# Patient Record
Sex: Male | Born: 1980 | Race: White | Hispanic: No | Marital: Married | Smoking: Never smoker
Health system: Southern US, Community
[De-identification: ages and names within clinical notes are randomized; demographics above are authoritative.]

## PROBLEM LIST (undated history)

## (undated) DIAGNOSIS — D689 Coagulation defect, unspecified: Secondary | ICD-10-CM

## (undated) DIAGNOSIS — M7989 Other specified soft tissue disorders: Secondary | ICD-10-CM

## (undated) DIAGNOSIS — B1920 Unspecified viral hepatitis C without hepatic coma: Secondary | ICD-10-CM

## (undated) DIAGNOSIS — G4733 Obstructive sleep apnea (adult) (pediatric): Secondary | ICD-10-CM

## (undated) DIAGNOSIS — T7840XA Allergy, unspecified, initial encounter: Secondary | ICD-10-CM

## (undated) DIAGNOSIS — D67 Hereditary factor IX deficiency: Secondary | ICD-10-CM

## (undated) HISTORY — DX: Coagulation defect, unspecified: D68.9

## (undated) HISTORY — DX: Allergy, unspecified, initial encounter: T78.40XA

## (undated) HISTORY — DX: Hereditary factor IX deficiency: D67

## (undated) HISTORY — PX: TONSILLECTOMY: SUR1361

---

## 2003-06-16 ENCOUNTER — Emergency Department (HOSPITAL_COMMUNITY): Admission: AD | Admit: 2003-06-16 | Discharge: 2003-06-16 | Payer: Self-pay | Admitting: Family Medicine

## 2012-05-31 ENCOUNTER — Ambulatory Visit: Payer: Self-pay | Admitting: Family Medicine

## 2012-05-31 VITALS — BP 114/79 | HR 86 | Temp 98.0°F | Resp 17 | Ht 71.5 in | Wt 323.0 lb

## 2012-05-31 DIAGNOSIS — B86 Scabies: Secondary | ICD-10-CM

## 2012-05-31 DIAGNOSIS — L282 Other prurigo: Secondary | ICD-10-CM

## 2012-05-31 MED ORDER — HYDROXYZINE HCL 25 MG PO TABS: 25.0000 mg | ORAL_TABLET | Freq: Three times a day (TID) | ORAL | Status: DC | PRN

## 2012-05-31 MED ORDER — PERMETHRIN 5 % EX CREA: TOPICAL_CREAM | Freq: Once | CUTANEOUS | Status: DC

## 2012-05-31 NOTE — Progress Notes (Signed)
  [facility]:  Office Visit  Chief Complaint:  Chief Complaint  Patient presents with  . Rash    all over body     HPI: Chris Winters is a 31 y.o. male who complains of  Rash all over body, itchy, started 2 months, girlfriend had it and was given scabies medicine. Tried otc meds ie hydrocortisone without releif.   Past Medical History  Diagnosis Date  . Hemophilia B    Past Surgical History  Procedure Date  . Tonsillectomy    History   Social History  . Marital Status: Single    Spouse Name: N/A    Number of Children: N/A  . Years of Education: N/A   Social History Main Topics  . Smoking status: Never Smoker   . Smokeless tobacco: None  . Alcohol Use: No  . Drug Use: No  . Sexually Active: Yes   Other Topics Concern  . None   Social History Narrative  . None   Family History  Problem Relation Age of Onset  . Diabetes Father   . Heart disease Father   . Cancer Maternal Grandmother   . Cancer Maternal Grandfather    Allergies  Allergen Reactions  . Penicillins    Prior to Admission medications   Medication Sig Start Date End Date Taking? Authorizing Provider  diphenhydrAMINE (BENADRYL) 25 mg capsule Take 25 mg by mouth every 6 (six) hours as needed.   Yes Historical Provider, MD  Pseudoephedrine-APAP-DM (DAYQUIL MULTI-SYMPTOM COLD/FLU PO) Take by mouth.   Yes Historical Provider, MD     ROS: The patient denies fevers, chills, night sweats, unintentional weight loss, chest pain, palpitations, wheezing, dyspnea on exertion, nausea, vomiting, abdominal pain, dysuria, hematuria, melena, numbness, weakness, or tingling.   All other systems have been reviewed and were otherwise negative with the exception of those mentioned in the HPI and as above.    PHYSICAL EXAM: Filed Vitals:   05/31/12 1018  BP: 114/79  Pulse: 86  Temp: 98 F (36.7 C)  Resp: 17   Filed Vitals:   05/31/12 1018  Height: 5' 11.5" (1.816 m)  Weight: 323 lb  (146.512 kg)   Body mass index is 44.42 kg/(m^2).  General: Alert, no acute distress HEENT:  Normocephalic, atraumatic, oropharynx patent.  Cardiovascular:  Regular rate and rhythm, no rubs murmurs or gallops.  No Carotid bruits, radial pulse intact. No pedal edema.  Respiratory: Clear to auscultation bilaterally.  No wheezes, rales, or rhonchi.  No cyanosis, no use of accessory musculature GI: No organomegaly, abdomen is soft and non-tender, positive bowel sounds.  No masses. Skin: + scabies, exocriated rash  Neurologic: Facial musculature symmetric. Psychiatric: Patient is appropriate throughout our interaction. Lymphatic: No cervical lymphadenopathy Musculoskeletal: Gait intact.   LABS: No results found for this or any previous visit.   EKG/XRAY:   Primary read interpreted by Dr. Conley Rolls at [facility]   ASSESSMENT/PLAN: Encounter Diagnoses  Name Primary?  . Scabies Yes  . Pruritic rash    Rx Permetherin refill x 1 may repeat in 2-4 weeks if still have residual outbreak. Precautions given Gave 90 gram since he is a big guy Rx Vistaril for itch F/u prn    Chris Cowles PHUONG, DO 05/31/2012 11:12 AM

## 2016-06-13 ENCOUNTER — Emergency Department: Payer: Self-pay

## 2016-06-13 ENCOUNTER — Encounter: Payer: Self-pay | Admitting: Emergency Medicine

## 2016-06-13 ENCOUNTER — Emergency Department
Admission: EM | Admit: 2016-06-13 | Discharge: 2016-06-13 | Payer: Self-pay | Attending: Emergency Medicine | Admitting: Emergency Medicine

## 2016-06-13 DIAGNOSIS — R609 Edema, unspecified: Secondary | ICD-10-CM | POA: Insufficient documentation

## 2016-06-13 DIAGNOSIS — R000 Tachycardia, unspecified: Secondary | ICD-10-CM | POA: Insufficient documentation

## 2016-06-13 DIAGNOSIS — Z79899 Other long term (current) drug therapy: Secondary | ICD-10-CM | POA: Insufficient documentation

## 2016-06-13 DIAGNOSIS — R0602 Shortness of breath: Secondary | ICD-10-CM | POA: Insufficient documentation

## 2016-06-13 LAB — BASIC METABOLIC PANEL
ANION GAP: 8 (ref 5–15)
BUN: 18 mg/dL (ref 6–20)
CALCIUM: 8.8 mg/dL — AB (ref 8.9–10.3)
CO2: 21 mmol/L — AB (ref 22–32)
Chloride: 104 mmol/L (ref 101–111)
Creatinine, Ser: 1.21 mg/dL (ref 0.61–1.24)
GFR calc non Af Amer: >60 mL/min (ref >60)
GLUCOSE: 138 mg/dL — AB (ref 65–99)
POTASSIUM: 3.9 mmol/L (ref 3.5–5.1)
Sodium: 133 mmol/L — ABNORMAL LOW (ref 135–145)

## 2016-06-13 LAB — CBC
HEMATOCRIT: 53.1 % — AB (ref 40.0–52.0)
HEMOGLOBIN: 18.2 g/dL — AB (ref 13.0–18.0)
MCH: 30.2 pg (ref 26.0–34.0)
MCHC: 34.2 g/dL (ref 32.0–36.0)
MCV: 88.2 fL (ref 80.0–100.0)
Platelets: 149 10*3/uL — ABNORMAL LOW (ref 150–440)
RBC: 6.02 MIL/uL — AB (ref 4.40–5.90)
RDW: 13.9 % (ref 11.5–14.5)
WBC: 12.5 10*3/uL — ABNORMAL HIGH (ref 3.8–10.6)

## 2016-06-13 LAB — BRAIN NATRIURETIC PEPTIDE: B NATRIURETIC PEPTIDE 5: 17 pg/mL (ref 0.0–100.0)

## 2016-06-13 LAB — TROPONIN I

## 2016-06-13 MED ORDER — FUROSEMIDE 10 MG/ML IJ SOLN
INTRAMUSCULAR | Status: AC
  Administered 2016-06-13: 40 mg via INTRAVENOUS
  Filled 2016-06-13: qty 4

## 2016-06-13 MED ORDER — IBUPROFEN 600 MG PO TABS
600.0000 mg | ORAL_TABLET | Freq: Once | ORAL | Status: AC
  Administered 2016-06-13: 600 mg via ORAL

## 2016-06-13 MED ORDER — FUROSEMIDE 10 MG/ML IJ SOLN
40.0000 mg | Freq: Once | INTRAMUSCULAR | Status: AC
Start: 2016-06-13 — End: 2016-06-13
  Administered 2016-06-13: 40 mg via INTRAVENOUS

## 2016-06-13 MED ORDER — IBUPROFEN 600 MG PO TABS
ORAL_TABLET | ORAL | Status: AC
  Administered 2016-06-13: 600 mg via ORAL
  Filled 2016-06-13: qty 1

## 2016-06-13 NOTE — ED Triage Notes (Signed)
Pt reports SOB and chest pain for past few days.

## 2016-06-13 NOTE — ED Notes (Signed)
Pt was off the monitor and had taken off the oxygen to eat and go to the bathroom. States feeling more short of breath. Back on oxygen and the monitor. sp02 stable and pt states feels better

## 2016-06-13 NOTE — ED Triage Notes (Signed)
Pt c/o SOB, cough chest pain and fever worsening over the past 2 weeks. Tmax of 104. HR 120 o2 Sat 89%

## 2016-06-13 NOTE — ED Notes (Signed)
EKG delayed due to Xray took patient before I got back to the room.

## 2016-06-13 NOTE — ED Notes (Signed)
Pt c/o feeling sob, states he attempted to lay on his left side to sleep and felt like he couldn't breath. Dr. Cyril Loosenkinner made aware, respiratory called for venti mask

## 2016-06-13 NOTE — ED Notes (Signed)
Ems on scene for transport 

## 2016-06-13 NOTE — ED Provider Notes (Signed)
[facility] Emergency Department Provider Note   ____________________________________________    I have reviewed the triage vital signs and the nursing notes.   HISTORY  Chief Complaint Shortness of breath    HPI Chris Winters is a 35 y.o. male who presents with complaints of shortness of breath which started yesterday. Patient reports that yesterday he started coughing and developed shortness of breath with exertion also with lying flat. He reports chest discomfort particularly when coughing. He denies recent travel. No calf pain or leg swelling. No fevers or chills. He reports an upper respiratory infection 3 weeks ago. He does have a history of hemophilia but no traumatic injuries. Denies a history of hypertension. He has never had this before   Past Medical History:  Diagnosis Date  . Hemophilia B (HCC)     There are no active problems to display for this patient.   Past Surgical History:  Procedure Laterality Date  . TONSILLECTOMY      Prior to Admission medications   Medication Sig Start Date End Date Taking? Authorizing Provider  diphenhydrAMINE (BENADRYL) 25 mg capsule Take 25 mg by mouth every 6 (six) hours as needed.    Historical Provider, MD  hydrOXYzine (ATARAX/VISTARIL) 25 MG tablet Take 1 tablet (25 mg total) by mouth 3 (three) times daily as needed for itching. 05/31/12   Thao P Le, DO  permethrin (ELIMITE) 5 % cream Apply topically once. May repeat in 2-4 weeks x 1 05/31/12   Thao P Le, DO  Pseudoephedrine-APAP-DM (DAYQUIL MULTI-SYMPTOM COLD/FLU PO) Take by mouth.    Historical Provider, MD     Allergies Penicillins and Prednisone  Family History  Problem Relation Age of Onset  . Diabetes Father   . Heart disease Father   . Cancer Maternal Grandmother   . Cancer Maternal Grandfather     Social History Social History  Substance Use Topics  . Smoking status: Never Smoker  . Smokeless tobacco: Never Used  . Alcohol  use No    Review of Systems  Constitutional: No fever/chills Eyes: No visual changes.   Cardiovascular: Discomfort of the chest as above Respiratory:Shortness of breath as above Gastrointestinal: No abdominal pain.  No nausea, no vomiting.    Musculoskeletal: Negative for back pain. Skin: Negative for rash. Neurological: Negative for headaches   10-point ROS otherwise negative.  ____________________________________________   PHYSICAL EXAM:  VITAL SIGNS: ED Triage Vitals  Enc Vitals Group     BP 06/13/16 1019 102/64     Pulse Rate 06/13/16 1019 (!) 120     Resp 06/13/16 1019 (!) 24     Temp 06/13/16 1019 98 F (36.7 C)     Temp Source 06/13/16 1019 Oral     SpO2 06/13/16 1019 (!) 89 %     Weight 06/13/16 1020 (!) 360 lb (163.3 kg)     Height 06/13/16 1020 6' (1.829 m)     Head Circumference --      Peak Flow --      Pain Score 06/13/16 1028 8     Pain Loc --      Pain Edu? --      Excl. in GC? --     Constitutional: Alert and oriented.  Pleasant and interactive Eyes: Conjunctivae are normal.  Head: Atraumatic. Nose: No congestion/rhinnorhea. Mouth/Throat: Mucous membranes are moist.    Cardiovascular: Tachycardia, regular rhythm.   Good peripheral circulation. Respiratory: Tachypnea.  No retractions.  Gastrointestinal: Soft and nontender. No  distention.  No CVA tenderness. Genitourinary: deferred Musculoskeletal: No lower extremity tenderness nor edema.  Warm and well perfused Neurologic:  Normal speech and language. No gross focal neurologic deficits are appreciated.  Skin:  Skin is warm, dry and intact. No rash noted. Psychiatric: Mood and affect are normal. Speech and behavior are normal.  ____________________________________________   LABS (all labs ordered are listed, but only abnormal results are displayed)  Labs Reviewed  BASIC METABOLIC PANEL - Abnormal; Notable for the following:       Result Value   Sodium 133 (*)    CO2 21 (*)    Glucose,  Bld 138 (*)    Calcium 8.8 (*)    All other components within normal limits  CBC - Abnormal; Notable for the following:    WBC 12.5 (*)    RBC 6.02 (*)    Hemoglobin 18.2 (*)    HCT 53.1 (*)    Platelets 149 (*)    All other components within normal limits  TROPONIN I  BRAIN NATRIURETIC PEPTIDE   ____________________________________________  EKG  ED ECG REPORT I, Jene EveryKINNER, Anahid Eskelson, the attending physician, personally viewed and interpreted this ECG.  Date: 06/13/2016  Rate: 113 Rhythm: Sinus tachycardia QRS Axis: normal Intervals: normal ST/T Wave abnormalities: normal Conduction Disturbances: none   ____________________________________________  RADIOLOGY  Chest x-ray with cardiomegaly and interstitial edema ____________________________________________   PROCEDURES  Procedure(s) performed: No    Critical Care performed: No ____________________________________________   INITIAL IMPRESSION / ASSESSMENT AND PLAN / ED COURSE  Pertinent labs & imaging results that were available during my care of the patient were reviewed by me and considered in my medical decision making (see chart for details).  Patient was sent with hypoxia, tachycardia started 1 day ago. His x-ray is concerning for cardiomegaly and interstitial edema however he has a history of normal blood pressure. He did have an upper respiratory/viral infection 3 weeks ago, regards we will treat with IV Lasix and oxygen. He will require admission, [facility].  Clinical Course   ----------------------------------------- 12:04 PM on 06/13/2016 -----------------------------------------  [facility] transfer initiated     ----------------------------------------- 3:24 PM on 06/13/2016 ----------------------------------------- [facility]   ____________________________________________   FINAL CLINICAL IMPRESSION(S) / ED DIAGNOSES  Final diagnoses:  Edema,  unspecified type  Shortness of breath      NEW MEDICATIONS STARTED DURING THIS VISIT:  New Prescriptions   No medications on file     Note:  This document was prepared using Dragon voice recognition software and may include unintentional dictation errors.    Jene Everyobert Lorisa Scheid, MD 06/13/16 [phone]

## 2016-07-02 ENCOUNTER — Other Ambulatory Visit: Payer: Self-pay | Admitting: Primary Care

## 2016-07-02 ENCOUNTER — Ambulatory Visit (INDEPENDENT_AMBULATORY_CARE_PROVIDER_SITE_OTHER): Payer: Self-pay | Admitting: Primary Care

## 2016-07-02 ENCOUNTER — Encounter: Payer: Self-pay | Admitting: Primary Care

## 2016-07-02 VITALS — BP 116/70 | HR 80 | Temp 97.3°F | Ht 71.25 in | Wt 340.0 lb

## 2016-07-02 DIAGNOSIS — D649 Anemia, unspecified: Secondary | ICD-10-CM

## 2016-07-02 DIAGNOSIS — D66 Hereditary factor VIII deficiency: Secondary | ICD-10-CM

## 2016-07-02 DIAGNOSIS — A419 Sepsis, unspecified organism: Secondary | ICD-10-CM

## 2016-07-02 DIAGNOSIS — B182 Chronic viral hepatitis C: Secondary | ICD-10-CM

## 2016-07-02 DIAGNOSIS — G4730 Sleep apnea, unspecified: Secondary | ICD-10-CM

## 2016-07-02 DIAGNOSIS — R6521 Severe sepsis with septic shock: Secondary | ICD-10-CM

## 2016-07-02 DIAGNOSIS — D67 Hereditary factor IX deficiency: Secondary | ICD-10-CM | POA: Insufficient documentation

## 2016-07-02 LAB — COMPREHENSIVE METABOLIC PANEL
ALT: 33 U/L (ref 0–53)
AST: 27 U/L (ref 0–37)
Albumin: 4 g/dL (ref 3.5–5.2)
Alkaline Phosphatase: 53 U/L (ref 39–117)
BILIRUBIN TOTAL: 0.7 mg/dL (ref 0.2–1.2)
BUN: 10 mg/dL (ref 6–23)
CALCIUM: 9.2 mg/dL (ref 8.4–10.5)
CO2: 28 mEq/L (ref 19–32)
Chloride: 104 mEq/L (ref 96–112)
Creatinine, Ser: 0.92 mg/dL (ref 0.40–1.50)
GFR: 99.78 mL/min (ref >60.00)
Glucose, Bld: 110 mg/dL — ABNORMAL HIGH (ref 70–99)
POTASSIUM: 4 meq/L (ref 3.5–5.1)
Sodium: 137 mEq/L (ref 135–145)
TOTAL PROTEIN: 7.4 g/dL (ref 6.0–8.3)

## 2016-07-02 NOTE — Assessment & Plan Note (Signed)
Witnessed apnea during recent hospitalization. Referral placed for sleep study. He is at high risk given obesity and body habitus.

## 2016-07-02 NOTE — Assessment & Plan Note (Signed)
Factor IX deficiency. Does not follow with hematology, most of [facility]. No bleeding. Will repeat CBC and iron studies in 1 month.

## 2016-07-02 NOTE — Progress Notes (Signed)
Subjective:    Patient ID: Chris Winters, male    DOB: 1981/01/07, 36 y.o.   MRN: [id number]  HPI  Mr. Chris Winters is a 36 year old male with a history of morbid obesity, ARDS syndrome, Factor IX deficiency, Septic Shock who presents today to establish care and discuss the problems mentioned below. Will obtain old records. He [facility]  1) Hospital Follow Up: [facility] with complaints of acute respiratory distress and was then [facility] for further work up. He had noted feeling fatigued for several months. He was found to be tachycardic, tachypneic, and with oxygen saturations at 83% on room air. He underwent work up including VBG (7.35/34/57), CBC (24/18.6/56/169), CMP (NA of 132, Potassium of 5.6, Creatinine of 1.4, LFT's WNL), Troponin (negative), Lactate (1.7), Flu (negative), ANA panel (negative), HIV (negative. ECG with sinus tachycardia, LVH. He was transitioned to BiPap in the ED due to increased efforts of breathing but could not tolerate and was therefore intubated and [facility]  During [facility] he continued to remain tachycardic, febrile, and hypotensive despite medications and fluid resuscitation. Chest xray showed bilateral opacities and bilateral effusions. He was treated with numerous courses of antibiotics and vasopressors. He did expel coffee ground emesis in his OG tube on 12/18 without significant drop in Hemoglobin. He was given Factor IX and PPI with improvement. Overall infectious work up unremarkable, right thoracentesis unremarkable, Echo unremarkable. 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 US of abdomen showed hepatic stenosis and mild splenomegaly. Towards discharge his labs overall improved including electrolytes and creatinine.  He was discharged home on 06/24/16 with recommendations for repeat BMP in 1 week, CBC, iron  studies in 1 month, outpatient sleep study, and hepatology evaluation for Hep C.  Since his discharge home he's feeling well. He denies shortness of breath, chest pain, dizziness, abdominal pain, bleeding. He does have some residual weakness to his lower extremities which he attributes to laying in the hospital for over 1 week. He has noticed a rough "spot" to the back of his head that was found initially during his hospitalization. The spot is tender, doesn't seem to be getting larger, no bleeding. He is agreeable to a sleep study for OSA, but would like to get [facility]. He is also agreeable to seeing the Hep C clinic for treatment.  Review of Systems  Constitutional: Negative for fatigue and fever.  HENT: Negative for congestion.   Respiratory: Negative for shortness of breath.   Cardiovascular: Negative for chest pain.  Gastrointestinal: Negative for abdominal pain.  Musculoskeletal: Negative for myalgias.  Skin:       Wound to occipital head  Neurological: Negative for dizziness and weakness.  Hematological: Negative for adenopathy.  Psychiatric/Behavioral:       Doing well since hospitalization       Past Medical History:  Diagnosis Date  . Hemophilia B (HCC)      Social History   Social History  . Marital status: Single    Spouse name: N/A  . Number of children: N/A  . Years of education: N/A   Occupational History  . Not on file.   Social History Main Topics  . Smoking status: Never Smoker  . Smokeless tobacco: Never Used  . Alcohol use No  . Drug use: No  . Sexual activity: Yes  Other Topics Concern  . Not on file   Social History Narrative   Engaged.   Works as an Camera operator.   Enjoys bowling and playing chess.        Past Surgical History:  Procedure Laterality Date  . TONSILLECTOMY      Family History  Problem Relation Age of Onset  . COPD Mother   . Diabetes Father   . Heart disease Father   . Lung cancer Maternal  Grandmother   . Lung cancer Maternal Grandfather     Allergies  Allergen Reactions  . Penicillins     Parents told him that he would go into anaphylactic shock if he takes it  . Aspirin   . Prednisone Other (See Comments)    Severe vomiting      No current outpatient prescriptions on file prior to visit.   No current facility-administered medications on file prior to visit.     BP 116/70   Pulse 80   Temp 97.3 F (36.3 C) (Oral)   Ht 5' 11.25" (1.81 m)   Wt (!) 340 lb (154.2 kg)   SpO2 97%   BMI 47.09 kg/m    Objective:   Physical Exam  Constitutional: He is oriented to person, place, and time. He appears well-nourished.  Neck: Neck supple.  Cardiovascular: Normal rate and regular rhythm.   Pulmonary/Chest: Effort normal and breath sounds normal.  Abdominal: Soft.  Neurological: He is alert and oriented to person, place, and time.  Skin: Skin is warm and dry.  Psychiatric: He has a normal mood and affect.          Assessment & Plan:  Hospital Follow Up:  Presented to [facility] with complaints of shortness of breath and fatigue. Experienced acute respiratory failure, was intubated and transferred to [facility]. While at [facility] he was treated for Septic Shock and ARDS.  He recovered well and was discharged home on 12/27. Overall he's doing well, regaining strength. Denies respiratory symptoms. Referral placed to Hepatology Clinic for treatment of Hep C. Referral placed for Sleep study for further evaluation of OSA. Repeat CMP today. Repeat CBC and iron studies in 1 month.  Strongly recommended annual physical either through me or the [facility]. He verbalized understanding.  All hospital labs, imaging, and notes.  Morrie Sheldon, NP

## 2016-07-02 NOTE — Assessment & Plan Note (Signed)
Diagnosed at age 36 secondary to blood transfusion. Referral placed to Hep C clinic for treatment.

## 2016-07-02 NOTE — Patient Instructions (Addendum)
Complete lab work prior to leaving today. I will notify you of your results once received.   Please return in 1 month for repeat blood counts. This will be a lab only appointment.  You will be contacted regarding your referral to the Hepatology Clinic and for your sleep study.  Please let us know if you have not heard back within one week.   Complete an annual physical in 2018.  It was a pleasure to meet you today! Please don't hesitate to call me with any questions. Welcome to [facility]!

## 2016-07-03 ENCOUNTER — Telehealth: Payer: Self-pay | Admitting: Primary Care

## 2016-07-03 NOTE — Telephone Encounter (Signed)
Called the patient to refer. He says [facility] for both Ref[facility], [facility] and for his Sleep Apnea. He said he pays out of pocket to come here so he [facility] the [facility].I will cancel the referral in Epic.

## 2016-07-03 NOTE — Telephone Encounter (Signed)
Noted. Please notify patient that he needs to notify [facility]CP [facility] that he needs referrals for both.

## 2016-07-06 NOTE — Telephone Encounter (Signed)
Left detailed message on VM that he needs to [facility]

## 2017-03-29 DIAGNOSIS — M7989 Other specified soft tissue disorders: Secondary | ICD-10-CM

## 2017-03-29 HISTORY — DX: Other specified soft tissue disorders: M79.89

## 2017-03-31 ENCOUNTER — Encounter (HOSPITAL_COMMUNITY): Payer: Self-pay | Admitting: *Deleted

## 2017-03-31 ENCOUNTER — Emergency Department (HOSPITAL_COMMUNITY): Payer: BLUE CROSS/BLUE SHIELD

## 2017-03-31 ENCOUNTER — Inpatient Hospital Stay (HOSPITAL_COMMUNITY)
Admission: EM | Admit: 2017-03-31 | Discharge: 2017-04-02 | DRG: 537 | Disposition: A | Discharge status: Home / Self Care | Payer: BLUE CROSS/BLUE SHIELD | Attending: Internal Medicine | Admitting: Internal Medicine

## 2017-03-31 DIAGNOSIS — S7012XA Contusion of left thigh, initial encounter: Secondary | ICD-10-CM | POA: Diagnosis present

## 2017-03-31 DIAGNOSIS — Z79899 Other long term (current) drug therapy: Secondary | ICD-10-CM

## 2017-03-31 DIAGNOSIS — M79652 Pain in left thigh: Secondary | ICD-10-CM

## 2017-03-31 DIAGNOSIS — Z825 Family history of asthma and other chronic lower respiratory diseases: Secondary | ICD-10-CM

## 2017-03-31 DIAGNOSIS — Z885 Allergy status to narcotic agent status: Secondary | ICD-10-CM

## 2017-03-31 DIAGNOSIS — Z888 Allergy status to other drugs, medicaments and biological substances status: Secondary | ICD-10-CM

## 2017-03-31 DIAGNOSIS — S76812A Strain of other specified muscles, fascia and tendons at thigh level, left thigh, initial encounter: Secondary | ICD-10-CM | POA: Diagnosis not present

## 2017-03-31 DIAGNOSIS — Z833 Family history of diabetes mellitus: Secondary | ICD-10-CM

## 2017-03-31 DIAGNOSIS — B1920 Unspecified viral hepatitis C without hepatic coma: Secondary | ICD-10-CM

## 2017-03-31 DIAGNOSIS — Y929 Unspecified place or not applicable: Secondary | ICD-10-CM

## 2017-03-31 DIAGNOSIS — B182 Chronic viral hepatitis C: Secondary | ICD-10-CM | POA: Diagnosis present

## 2017-03-31 DIAGNOSIS — Z801 Family history of malignant neoplasm of trachea, bronchus and lung: Secondary | ICD-10-CM

## 2017-03-31 DIAGNOSIS — G4733 Obstructive sleep apnea (adult) (pediatric): Secondary | ICD-10-CM | POA: Diagnosis present

## 2017-03-31 DIAGNOSIS — M7989 Other specified soft tissue disorders: Secondary | ICD-10-CM | POA: Diagnosis present

## 2017-03-31 DIAGNOSIS — Z8249 Family history of ischemic heart disease and other diseases of the circulatory system: Secondary | ICD-10-CM

## 2017-03-31 DIAGNOSIS — Z9889 Other specified postprocedural states: Secondary | ICD-10-CM

## 2017-03-31 DIAGNOSIS — Z880 Allergy status to penicillin: Secondary | ICD-10-CM

## 2017-03-31 DIAGNOSIS — D67 Hereditary factor IX deficiency: Secondary | ICD-10-CM | POA: Diagnosis present

## 2017-03-31 DIAGNOSIS — Y9364 Activity, baseball: Secondary | ICD-10-CM

## 2017-03-31 DIAGNOSIS — D649 Anemia, unspecified: Secondary | ICD-10-CM | POA: Diagnosis present

## 2017-03-31 DIAGNOSIS — Z23 Encounter for immunization: Secondary | ICD-10-CM

## 2017-03-31 HISTORY — DX: Other specified soft tissue disorders: M79.89

## 2017-03-31 HISTORY — DX: Unspecified viral hepatitis C without hepatic coma: B19.20

## 2017-03-31 HISTORY — DX: Obstructive sleep apnea (adult) (pediatric): G47.33

## 2017-03-31 LAB — CBC WITH DIFFERENTIAL/PLATELET
Basophils Absolute: 0 10*3/uL (ref 0.0–0.1)
Basophils Relative: 0 %
EOS ABS: 0 10*3/uL (ref 0.0–0.7)
Eosinophils Relative: 1 %
HEMATOCRIT: 36.3 % — AB (ref 39.0–52.0)
HEMOGLOBIN: 11.9 g/dL — AB (ref 13.0–17.0)
LYMPHS ABS: 2 10*3/uL (ref 0.7–4.0)
LYMPHS PCT: 27 %
MCH: 29.6 pg (ref 26.0–34.0)
MCHC: 32.8 g/dL (ref 30.0–36.0)
MCV: 90.3 fL (ref 78.0–100.0)
MONOS PCT: 10 %
Monocytes Absolute: 0.7 10*3/uL (ref 0.1–1.0)
NEUTROS PCT: 62 %
Neutro Abs: 4.5 10*3/uL (ref 1.7–7.7)
Platelets: 123 10*3/uL — ABNORMAL LOW (ref 150–400)
RBC: 4.02 MIL/uL — ABNORMAL LOW (ref 4.22–5.81)
RDW: 12.9 % (ref 11.5–15.5)
WBC: 7.3 10*3/uL (ref 4.0–10.5)

## 2017-03-31 LAB — COMPREHENSIVE METABOLIC PANEL
ALK PHOS: 41 U/L (ref 38–126)
ALT: 16 U/L — ABNORMAL LOW (ref 17–63)
ANION GAP: 10 (ref 5–15)
AST: 23 U/L (ref 15–41)
Albumin: 4.1 g/dL (ref 3.5–5.0)
BILIRUBIN TOTAL: 1.1 mg/dL (ref 0.3–1.2)
BUN: 11 mg/dL (ref 6–20)
CALCIUM: 9.2 mg/dL (ref 8.9–10.3)
CO2: 24 mmol/L (ref 22–32)
CREATININE: 1.06 mg/dL (ref 0.61–1.24)
Chloride: 102 mmol/L (ref 101–111)
Glucose, Bld: 92 mg/dL (ref 65–99)
Potassium: 4.2 mmol/L (ref 3.5–5.1)
SODIUM: 136 mmol/L (ref 135–145)
TOTAL PROTEIN: 7.3 g/dL (ref 6.5–8.1)

## 2017-03-31 MED ORDER — VITAMIN C 500 MG PO TABS
500.0000 mg | ORAL_TABLET | Freq: Every day | ORAL | Status: DC
  Administered 2017-04-01 – 2017-04-02 (×2): 500 mg via ORAL
  Filled 2017-03-31 (×2): qty 1

## 2017-03-31 MED ORDER — ONDANSETRON HCL 4 MG PO TABS: 4.0000 mg | ORAL_TABLET | Freq: Four times a day (QID) | ORAL | Status: DC | PRN

## 2017-03-31 MED ORDER — COAGULATION FACTOR IX (RECOMB) 1000 UNITS IV KIT
8780.0000 [IU] | PACK | Freq: Once | INTRAVENOUS | Status: AC
  Administered 2017-03-31: 8780 [IU] via INTRAVENOUS
  Filled 2017-03-31: qty 8780

## 2017-03-31 MED ORDER — ACETAMINOPHEN 325 MG PO TABS: 650.0000 mg | ORAL_TABLET | Freq: Four times a day (QID) | ORAL | Status: DC | PRN

## 2017-03-31 MED ORDER — ACETAMINOPHEN 650 MG RE SUPP: 650.0000 mg | Freq: Four times a day (QID) | RECTAL | Status: DC | PRN

## 2017-03-31 MED ORDER — LORATADINE 10 MG PO TABS
10.0000 mg | ORAL_TABLET | Freq: Every day | ORAL | Status: DC
  Administered 2017-04-01 – 2017-04-02 (×2): 10 mg via ORAL
  Filled 2017-03-31 (×2): qty 1

## 2017-03-31 MED ORDER — IOPAMIDOL (ISOVUE-300) INJECTION 61%
INTRAVENOUS | Status: AC
  Administered 2017-03-31: 100 mL
  Filled 2017-03-31: qty 100

## 2017-03-31 MED ORDER — ADULT MULTIVITAMIN W/MINERALS CH
1.0000 | ORAL_TABLET | Freq: Every day | ORAL | Status: DC
  Administered 2017-04-01 – 2017-04-02 (×2): 1 via ORAL
  Filled 2017-03-31 (×2): qty 1

## 2017-03-31 MED ORDER — ONDANSETRON HCL 4 MG/2ML IJ SOLN: 4.0000 mg | Freq: Four times a day (QID) | INTRAMUSCULAR | Status: DC | PRN

## 2017-03-31 MED ORDER — BIOTIN 5 MG PO CAPS: 1.0000 | ORAL_CAPSULE | Freq: Every day | ORAL | Status: DC

## 2017-03-31 MED ORDER — MELATONIN 3 MG PO TABS
3.0000 mg | ORAL_TABLET | Freq: Every day | ORAL | Status: DC
  Administered 2017-04-01 (×2): 3 mg via ORAL
  Filled 2017-03-31 (×3): qty 1

## 2017-03-31 NOTE — ED Provider Notes (Signed)
[facility]EMERGENCY DEPT Provider Note   CSN: [id number] Arrival date & time: 03/31/17  1644     History   Chief Complaint Chief Complaint  Patient presents with  . Leg Pain    HPI Chris Winters is a 36 y.o. male.  The history is provided by the patient and medical records. No language interpreter was used.   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 Felt sharp hamstring pull while playing baseball 3 days ago. Has noted progressive swelling of L thigh with increased proximal and distal swelling. Pain with ambulating, taking ibuprofen. Has another injury at same time of R shin that healed. Does not currently take Factor and has been lost to followup with HemOnc. Last received factor several years ago. Previously followed with Hem[facility]c at [facility] and [place]. Was admitted to [facility] in 05/2016 for septic shock from pneumonia. Denies other injuries, bleeding, or symptoms.  Past Medical History:  Diagnosis Date  . HCV (hepatitis C virus)   . Hemophilia B (HCC)   . OSA (obstructive sleep apnea)     Patient Active Problem List   Diagnosis Date Noted  . Left leg swelling 03/31/2017  . Anemia 03/31/2017  . 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 Hemophilia (HCC) 07/02/2016    Past Surgical History:  Procedure Laterality Date  . TONSILLECTOMY         Home Medications    Prior to Admission medications   Medication Sig Start Date End Date Taking? Authorizing Provider  Ascorbic Acid (VITAMIN C PO) Take 1 tablet by mouth daily.   Yes [provider]  BIOTIN PO Take 1 tablet by mouth daily.   Yes [provider]  cetirizine (ZYRTEC) 10 MG tablet Take 10 mg by mouth daily.   Yes [provider]  ibuprofen (ADVIL,MOTRIN) 200 MG tablet Take 200-400 mg by mouth every 6 (six) hours as needed (for pain).   Yes [provider]  Melatonin 5 MG TABS Take 5 mg by mouth at bedtime.   Yes [provider]  Multiple  Vitamins-Minerals (CENTRUM MEN) TABS Take 1 tablet by mouth daily.   Yes [provider]    Family History Family History  Problem Relation Age of Onset  . COPD Mother   . Diabetes Father   . Heart disease Father   . Lung cancer Maternal Grandmother   . Lung cancer Maternal Grandfather     Social History Social History  Substance Use Topics  . Smoking status: Never Smoker  . Smokeless tobacco: Never Used  . Alcohol use No     Allergies   Penicillins; Aspirin; Morphine and related; and Prednisone   Review of Systems Review of Systems  Constitutional: Negative for chills and fever.  HENT: Negative for ear pain and sore throat.   Eyes: Negative for pain and visual disturbance.  Respiratory: Negative for cough and shortness of breath.   Cardiovascular: Negative for chest pain and palpitations.  Gastrointestinal: Negative for abdominal pain and vomiting.  Genitourinary: Negative for dysuria and hematuria.  Musculoskeletal: Negative for arthralgias and back pain.       Positive for L thigh swelling  Skin: Negative for color change and rash.  Neurological: Negative for seizures and syncope.  All other systems reviewed and are negative.    Physical Exam Updated Vital Signs BP (!) 106/59 (BP Location: Left Arm)   Pulse 81   Temp 98.2 F (36.8 C) (Oral)   Resp  18   Ht 6' (1.829 m)   Wt (!) 140.3 kg (309 lb 6.4 oz)   SpO2 97%   BMI 41.96 kg/m   Physical Exam  Constitutional: He appears well-developed and well-nourished.  HENT:  Head: Normocephalic and atraumatic.  Eyes: Conjunctivae are normal.  Neck: Neck supple.  Cardiovascular: Normal rate and regular rhythm.   No murmur heard. Pulmonary/Chest: Effort normal and breath sounds normal. No respiratory distress.  Abdominal: Soft. There is no tenderness.  Musculoskeletal: He exhibits edema (Notable edema of posterior L thigh with asymmetry compared with right.) and tenderness.  Neurological: He is alert.  No cranial nerve deficit. Coordination normal.  5/5 motor strength and intact sensation in all extremities. Intact bilateral finger-to-nose coordination  Skin: Skin is warm and dry.  Nursing note and vitals reviewed.    ED Treatments / Results  Labs (all labs ordered are listed, but only abnormal results are displayed) Labs Reviewed  CBC WITH DIFFERENTIAL/PLATELET - Abnormal; Notable for the following:       Result Value   RBC 4.02 (*)    Hemoglobin 11.9 (*)    HCT 36.3 (*)    Platelets 123 (*)    All other components within normal limits  COMPREHENSIVE METABOLIC PANEL - Abnormal; Notable for the following:    ALT 16 (*)    All other components within normal limits  CBC - Abnormal; Notable for the following:    RBC 3.83 (*)    Hemoglobin 11.5 (*)    HCT 34.5 (*)    Platelets 125 (*)    All other components within normal limits  BASIC METABOLIC PANEL - Abnormal; Notable for the following:    Calcium 8.6 (*)    All other components within normal limits  PROTIME-INR  APTT  FACTOR 9 ASSAY  HIV ANTIBODY (ROUTINE TESTING)  TYPE AND SCREEN  ABO/RH    EKG  EKG Interpretation None       Radiology Ct Extremity Lower Left W Contrast  Result Date: 03/31/2017 CLINICAL DATA:  Hamstring injury. EXAM: CT OF THE LOWER LEFT EXTREMITY WITH CONTRAST TECHNIQUE: Multidetector CT imaging of the lower left extremity was performed according to the standard protocol following intravenous contrast administration. COMPARISON:  Left femur x-rays from same day. CONTRAST:  < 100 cc > ISOVUE-300 IOPAMIDOL (ISOVUE-300) INJECTION 61% FINDINGS: Bones/Joint/Cartilage There is motion artifact through the lower leg. No acute fracture or malalignment. Joint spaces are preserved. Bone mineralization is normal. Ligaments Suboptimally assessed by CT. Muscles and Tendons There is extensive edema and hemorrhage within the posterior compartment of the thigh, involving the semimembranosus, semitendinosis, and  biceps femoris muscles. The proximal tendon origins are not well evaluated due to streak artifact and technique. The distal tendons appear intact. Soft tissues Soft tissue edema along the posterior thigh and extending along the lateral aspect of the proximal lower leg. Patent vasculature without evidence of contrast extravasation. IMPRESSION: 1. Extensive edema and hemorrhage within the posterior compartment of the thigh, involving all three hamstring muscles, concerning for high-grade injury. The proximal tendon origins are not well evaluated due to streak artifact and technique. Nonemergent MRI is recommended for better characterization of the degree of injury. Electronically Signed   By: Obie Dredge M.D.   On: 03/31/2017 22:08   Dg Femur Min 2 Views Left  Result Date: 03/31/2017 CLINICAL DATA:  Left leg swelling.  Hemophilia. EXAM: LEFT FEMUR 2 VIEWS COMPARISON:  None. FINDINGS: There is no evidence of fracture or other focal bone lesions.  Soft tissues are unremarkable. IMPRESSION: Negative. Electronically Signed   By: Deatra Robinson M.D.   On: 03/31/2017 19:09    Procedures Procedures (including critical care time)  Medications Ordered in ED Medications  vitamin C (ASCORBIC ACID) tablet 500 mg (not administered)  loratadine (CLARITIN) tablet 10 mg (not administered)  Melatonin TABS 3 mg (3 mg Oral Given 04/01/17 0147)  multivitamin with minerals tablet 1 tablet (not administered)  acetaminophen (TYLENOL) tablet 650 mg (not administered)    Or  acetaminophen (TYLENOL) suppository 650 mg (not administered)  ondansetron (ZOFRAN) tablet 4 mg (not administered)    Or  ondansetron (ZOFRAN) injection 4 mg (not administered)  Influenza vac split quadrivalent PF (FLUARIX) injection 0.5 mL (not administered)  iopamidol (ISOVUE-300) 61 % injection (100 mLs  Contrast Given 03/31/17 2032)  coagulation factor IX (recomb) (BENEFIX) injection 8,780 Units (8,780 Units Intravenous Given 03/31/17 2321)      Initial Impression / Assessment and Plan / ED Course  I have reviewed the triage vital signs and the nursing notes.  Pertinent labs & imaging results that were available during my care of the patient were reviewed by me and considered in my medical decision making (see chart for details).     10 yoM h/o Hemophilia B who p/w L thigh pain and swelling after sports injury 4 days ago. L thigh with diffuse edema compared with right. No significant external bruising. No pain with passive stretching or distal numbness or weakness. NVI. AF, VSS.  CBC showing Hgb decline from 18 on 05/2016 to 12 today. CT LLE showing extensive edema and hemorrhage within posterior compartment.  Attempted x5 to reach on call Hematologist. Unable to reach hematology. Factor 9 ordered with pharmacy input. Pt admitted for further monitoring. Pt stable at time of transfer.  Final Clinical Impressions(s) / ED Diagnoses   Final diagnoses:  Left thigh pain  Hepatitis C virus infection without hepatic coma, unspecified chronicity    New Prescriptions Current Discharge Medication List       Hebert Soho, MD 04/01/17 1191    Jacalyn Lefevre, MD 04/01/17 2256

## 2017-03-31 NOTE — ED Triage Notes (Signed)
To ED for eval of left leg swelling and tightness since pulling hamstring on Sunday. Pt states he is hemophiliac and has not Factor. Leg is taught.

## 2017-03-31 NOTE — Progress Notes (Signed)

## 2017-03-31 NOTE — H&P (Signed)
History and Physical    Chris Winters ZOX:[id number] DOB: 1981/01/14 DOA: 03/31/2017  Referring MD/NP/PA:   PCP: Doreene Nest, NP   Patient coming from:  The patient is coming from home.  At baseline, pt is independent for most of ADL.   Chief Complaint: left leg swelling and tightness  HPI: Chris Winters is a 36 y.o. male with medical history significant of hemophilia B, chronic HCV, OSA not on CPAP, who presents with left leg swelling and tightness.  Pt states that he had pulled his hamstring while playing baseball 3 days ago. He developed swelling and tightness in the posterior left thigh. Pt dose not have significant pain at rest, but has mild pain on walking. Patient does not have tingling, numbness or weakness in his left leg. He also had another injury at same time of R shin that healed. He last received Factor IX infusion was 05/2017 due to stomach bleeding.  Previously followed with HemOnc at [facility] and [place]. Patient does not have chest pain, shortness breath, cough, fever, chills, nausea, vomiting, diarrhea, abdominal pain, symptoms of UTI. No rectal bleeding, hematuria, hematemesis.  ED Course: pt was found to have Hemoglobin dropped from 118.2 on 06/13/16-11.9, electrolytes renal function okay, no tachycardia, temperature normal, O2 sat are 98% on room air. X-ray of left femur is negative.   CT of left leg showed: Extensive edema and hemorrhage within the posterior compartment of the thigh, involving all three hamstring muscles, concerning for high-grade injury. The proximal tendon origins are not well evaluated due to streak artifact and technique.   Review of Systems:   General: no fevers, chills, no body weight gain. HEENT: no blurry vision, hearing changes or sore throat Respiratory: no dyspnea, coughing, wheezing CV: no chest pain, no palpitations GI: no nausea, vomiting, abdominal pain, diarrhea, constipation GU: no dysuria, burning on urination, increased  urinary frequency, hematuria  Ext: has left thigh swelling Neuro: no unilateral weakness, numbness, or tingling, no vision change or hearing loss Skin: no rash, no skin tear. MSK: No muscle spasm, no deformity, no limitation of range of movement in spin Heme: No easy bruising.  Travel history: No recent long distant travel.  Allergy:  Allergies  Allergen Reactions  . Penicillins Anaphylaxis    Parents told him that he would go into anaphylactic shock if he takes it Has patient had a PCN reaction causing immediate rash, facial/tongue/throat swelling, SOB or lightheadedness with hypotension: Yes Has patient had a PCN reaction causing severe rash involving mucus membranes or skin necrosis: No Has patient had a PCN reaction that required hospitalization: No Has patient had a PCN reaction occurring within the last 10 years: No If all of the above answers are "NO", then may proceed with Cephal  . Aspirin Other (See Comments)    Hemophiliac    . Morphine And Related Other (See Comments)    Patient has a very high tolerance to Morphine  . Prednisone Nausea And Vomiting    Severe vomiting      Past Medical History:  Diagnosis Date  . HCV (hepatitis C virus)   . Hemophilia B (HCC)   . OSA (obstructive sleep apnea)     Past Surgical History:  Procedure Laterality Date  . TONSILLECTOMY      Social History:  reports that he has never smoked. He has never used smokeless tobacco. He reports that he does not drink alcohol or use drugs.  Family History:  Family History  Problem Relation Age  of Onset  . COPD Mother   . Diabetes Father   . Heart disease Father   . Lung cancer Maternal Grandmother   . Lung cancer Maternal Grandfather      Prior to Admission medications   Medication Sig Start Date End Date Taking? Authorizing Provider  Ascorbic Acid (VITAMIN C PO) Take 1 tablet by mouth daily.   Yes [provider]  BIOTIN PO Take 1 tablet by mouth daily.   Yes [provider]  cetirizine (ZYRTEC) 10 MG tablet Take 10 mg by mouth daily.   Yes [provider]  ibuprofen (ADVIL,MOTRIN) 200 MG tablet Take 200-400 mg by mouth every 6 (six) hours as needed (for pain).   Yes [provider]  Melatonin 5 MG TABS Take 5 mg by mouth at bedtime.   Yes [provider]  Multiple Vitamins-Minerals (CENTRUM MEN) TABS Take 1 tablet by mouth daily.   Yes [provider]    Physical Exam: Vitals:   04/01/17 0000 04/01/17 0015 04/01/17 0030 04/01/17 0045  BP: 102/66 109/60 (!) 109/46 116/69  Pulse: 88 82 82 85  Resp:      Temp:      TempSrc:      SpO2: 98% 98% 99% 99%  Weight:      Height:       General: Not in acute distress HEENT:       Eyes: PERRL, EOMI, no scleral icterus.       ENT: No discharge from the ears and nose, no pharynx injection, no tonsillar enlargement.        Neck: No JVD, no bruit, no mass felt. Heme: No neck lymph node enlargement. Cardiac: S1/S2, RRR, No murmurs, No gallops or rubs. Respiratory: No rales, wheezing, rhonchi or rubs. GI: Soft, nondistended, nontender, no rebound pain, no organomegaly, BS present. GU: No hematuria Ext:  2+DP/PT pulse bilaterally. Has swelling and bruise to posterior L thigh, with mild tenderness.  Musculoskeletal: No joint deformities, No joint redness or warmth, no limitation of ROM in spin. Skin: No rashes.  Neuro: Alert, oriented X3, cranial nerves II-XII grossly intact, moves all extremities normally.  Psych: Patient is not psychotic, no suicidal or hemocidal ideation.  Labs on Admission: I have personally reviewed following labs and imaging studies  CBC:  Recent Labs Lab 03/31/17 1928  WBC 7.3  NEUTROABS 4.5  HGB 11.9*  HCT 36.3*  MCV 90.3  PLT 123*   Basic Metabolic Panel:  Recent Labs Lab 03/31/17 1928  NA 136  K 4.2  CL 102  CO2 24  GLUCOSE 92  BUN 11  CREATININE 1.06  CALCIUM 9.2   GFR: Estimated Creatinine Clearance: 138.4 mL/min  (by C-G formula based on SCr of 1.06 mg/dL). Liver Function Tests:  Recent Labs Lab 03/31/17 1928  AST 23  ALT 16*  ALKPHOS 41  BILITOT 1.1  PROT 7.3  ALBUMIN 4.1   No results for input(s): LIPASE, AMYLASE in the last 168 hours. No results for input(s): AMMONIA in the last 168 hours. Coagulation Profile: No results for input(s): INR, PROTIME in the last 168 hours. Cardiac Enzymes: No results for input(s): CKTOTAL, CKMB, CKMBINDEX, TROPONINI in the last 168 hours. BNP (last 3 results) No results for input(s): PROBNP in the last 8760 hours. HbA1C: No results for input(s): HGBA1C in the last 72 hours. CBG: No results for input(s): GLUCAP in the last 168 hours. Lipid Profile: No results for input(s): CHOL, HDL, LDLCALC, TRIG, CHOLHDL, LDLDIRECT in the  last 72 hours. Thyroid Function Tests: No results for input(s): TSH, T4TOTAL, FREET4, T3FREE, THYROIDAB in the last 72 hours. Anemia Panel: No results for input(s): VITAMINB12, FOLATE, FERRITIN, TIBC, IRON, RETICCTPCT in the last 72 hours. Urine analysis: No results found for: COLORURINE, APPEARANCEUR, LABSPEC, PHURINE, GLUCOSEU, HGBUR, BILIRUBINUR, KETONESUR, PROTEINUR, UROBILINOGEN, NITRITE, LEUKOCYTESUR Sepsis Labs: (procalcitonin:4,lacticidven:4) )No results found for this or any previous visit (from the past 240 hour(s)).   Radiological Exams on Admission: Ct Extremity Lower Left W Contrast  Result Date: 03/31/2017 CLINICAL DATA:  Hamstring injury. EXAM: CT OF THE LOWER LEFT EXTREMITY WITH CONTRAST TECHNIQUE: Multidetector CT imaging of the lower left extremity was performed according to the standard protocol following intravenous contrast administration. COMPARISON:  Left femur x-rays from same day. CONTRAST:  < 100 cc > ISOVUE-300 IOPAMIDOL (ISOVUE-300) INJECTION 61% FINDINGS: Bones/Joint/Cartilage There is motion artifact through the lower leg. No acute fracture or malalignment. Joint spaces are preserved. Bone  mineralization is normal. Ligaments Suboptimally assessed by CT. Muscles and Tendons There is extensive edema and hemorrhage within the posterior compartment of the thigh, involving the semimembranosus, semitendinosis, and biceps femoris muscles. The proximal tendon origins are not well evaluated due to streak artifact and technique. The distal tendons appear intact. Soft tissues Soft tissue edema along the posterior thigh and extending along the lateral aspect of the proximal lower leg. Patent vasculature without evidence of contrast extravasation. IMPRESSION: 1. Extensive edema and hemorrhage within the posterior compartment of the thigh, involving all three hamstring muscles, concerning for high-grade injury. The proximal tendon origins are not well evaluated due to streak artifact and technique. Nonemergent MRI is recommended for better characterization of the degree of injury. Electronically Signed   By: Obie Dredge M.D.   On: 03/31/2017 22:08   Dg Femur Min 2 Views Left  Result Date: 03/31/2017 CLINICAL DATA:  Left leg swelling.  Hemophilia. EXAM: LEFT FEMUR 2 VIEWS COMPARISON:  None. FINDINGS: There is no evidence of fracture or other focal bone lesions. Soft tissues are unremarkable. IMPRESSION: Negative. Electronically Signed   By: Deatra Robinson M.D.   On: 03/31/2017 19:09     EKG: Not done in ED  Assessment/Plan Principal Problem:   Left leg swelling Active Problems:   Chronic hepatitis C without hepatic coma (HCC)   Hemophilia (HCC)   Anemia   Left leg swelling due to bleeding 2/2 to Hemophilia B: Hgb dropped from 18.2-->11.9. Hemodynamically stable. No tachycardia, dizziness, lightheadedness, shortness of breath or chest pain. Patient does not have tingling, numbness or weakness in his left leg. He has good pulse, less likely to have compartment syndrome.  -will place tele bed for obs -will give Factor IX infusion. Discussed with pharmacist, Florentina Addison. We only have partial dose of  Factor IX now, which will roughly bring up pt's blood Factor IX level to 40% of normal level. Pharmacist is kindly contacting other facility for getting more Factor IX. It is not sure if Factor IX can be given repeatedly today. EDP tried several times to contact the hematologist, Dr. Clelia Croft without success. - INR/PTT/type & screen  Chronic hepatitis C without hepatic coma [facility]): Patient states that he got HCV from blood transfusion at age 59. He was treated partially. Today his liver function is normal. -Follow up with PCP  Anemia: Hgb 11.9. -f/u by CBC.   DVT ppx: SCD only to right leg Code Status: Full code Family Communication:  Yes, patient's wife at bed side Disposition Plan:  Anticipate discharge back to previous home  environment Consults called:  none Admission status: Obs / tele    Date of Service 04/01/2017    Lorretta Harp [facility] Pager [phone]  If 7PM-7AM, please contact night-coverage [email or web address] Password [email or web address] 04/01/2017, 1:40 AM

## 2017-03-31 NOTE — ED Notes (Signed)
Patient transported to CT 

## 2017-04-01 ENCOUNTER — Encounter (HOSPITAL_COMMUNITY): Payer: Self-pay | Admitting: General Practice

## 2017-04-01 ENCOUNTER — Observation Stay (HOSPITAL_COMMUNITY): Payer: BLUE CROSS/BLUE SHIELD

## 2017-04-01 DIAGNOSIS — T148XXA Other injury of unspecified body region, initial encounter: Secondary | ICD-10-CM

## 2017-04-01 DIAGNOSIS — M7989 Other specified soft tissue disorders: Secondary | ICD-10-CM

## 2017-04-01 DIAGNOSIS — S79922D Unspecified injury of left thigh, subsequent encounter: Secondary | ICD-10-CM

## 2017-04-01 DIAGNOSIS — D66 Hereditary factor VIII deficiency: Secondary | ICD-10-CM

## 2017-04-01 LAB — PROTIME-INR
INR: 1.09
INR: 1.05
PROTHROMBIN TIME: 14 s (ref 11.4–15.2)
Prothrombin Time: 13.6 seconds (ref 11.4–15.2)

## 2017-04-01 LAB — APTT: aPTT: 35 seconds (ref 24–36)

## 2017-04-01 LAB — CBC
HEMATOCRIT: 34.5 % — AB (ref 39.0–52.0)
HEMOGLOBIN: 11.5 g/dL — AB (ref 13.0–17.0)
MCH: 30 pg (ref 26.0–34.0)
MCHC: 33.3 g/dL (ref 30.0–36.0)
MCV: 90.1 fL (ref 78.0–100.0)
Platelets: 125 10*3/uL — ABNORMAL LOW (ref 150–400)
RBC: 3.83 MIL/uL — AB (ref 4.22–5.81)
RDW: 13.3 % (ref 11.5–15.5)
WBC: 6.8 10*3/uL (ref 4.0–10.5)

## 2017-04-01 LAB — BASIC METABOLIC PANEL
Anion gap: 10 (ref 5–15)
BUN: 10 mg/dL (ref 6–20)
CHLORIDE: 104 mmol/L (ref 101–111)
CO2: 22 mmol/L (ref 22–32)
Calcium: 8.6 mg/dL — ABNORMAL LOW (ref 8.9–10.3)
Creatinine, Ser: 0.89 mg/dL (ref 0.61–1.24)
GFR calc non Af Amer: >60 mL/min (ref >60)
Glucose, Bld: 91 mg/dL (ref 65–99)
POTASSIUM: 3.8 mmol/L (ref 3.5–5.1)
SODIUM: 136 mmol/L (ref 135–145)

## 2017-04-01 LAB — TYPE AND SCREEN
ABO/RH(D): A POS
ANTIBODY SCREEN: NEGATIVE

## 2017-04-01 LAB — HIV ANTIBODY (ROUTINE TESTING W REFLEX): HIV SCREEN 4TH GENERATION: NONREACTIVE

## 2017-04-01 LAB — ABO/RH: ABO/RH(D): A POS

## 2017-04-01 MED ORDER — COAGULATION FACTOR IX (RECOMB) 1000 UNITS IV KIT
13680.0000 [IU] | PACK | INTRAVENOUS | Status: DC
  Filled 2017-04-01: qty 13680

## 2017-04-01 MED ORDER — INFLUENZA VAC SPLIT QUAD 0.5 ML IM SUSY
0.5000 mL | PREFILLED_SYRINGE | INTRAMUSCULAR | Status: AC
  Administered 2017-04-02: 0.5 mL via INTRAMUSCULAR
  Filled 2017-04-01: qty 0.5

## 2017-04-01 MED ORDER — COAGULATION FACTOR IX (RECOMB) 1000 UNITS IV KIT
13680.0000 [IU] | PACK | Freq: Once | INTRAVENOUS | Status: AC
  Administered 2017-04-02: 13680 [IU] via INTRAVENOUS
  Filled 2017-04-01: qty 13680

## 2017-04-01 MED ORDER — COAGULATION FACTOR IX (RECOMB) 1000 UNITS IV KIT
13680.0000 [IU] | PACK | Freq: Once | INTRAVENOUS | Status: AC
  Administered 2017-04-01: 13680 [IU] via INTRAVENOUS
  Filled 2017-04-01: qty 13680

## 2017-04-01 NOTE — Progress Notes (Signed)
Instructed pt GN:[id number]. Pt VU. IV patent. No complications noted when flushed. Administration of Factor IV given without complications. Pt denied any changes or new symptoms at this time. VU of s/sx to report. Tomasita Morrow, RN VAST

## 2017-04-01 NOTE — Discharge Instructions (Signed)
Ok to walk on left leg but no running.  Use ice and NSAID's if allowed by hematology.

## 2017-04-01 NOTE — Progress Notes (Addendum)
CM made call to [facility] call center [phone]) and notification given regarding pt current hospital visit. Gae Gallop RN,BSN,CM

## 2017-04-01 NOTE — Consult Note (Signed)
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 male with history of Hemophilia B with infrequent bleeding episodes with exclusively soft-tissue bleeding with injuries. Significantly more bleeding episodes during childhood, and much fewer in adulthood. Previous Hemophilia care through [facility] and [place], but lost to f/u recently.  Patient presented to the ER last night after he noticed progressive pain and swelling in the left hamstring. He initially felt a sharp hamstring pull while playing baseball 3 days prior. He self-medicated with ibuprofen, but the pain and swelling including the distal LE swelling have progressed. Denies paresthesia, loss of sensation or discoloration of the lower extremity at any time. In the ER, he has received [id number] of BeneFIX (62% replacement and the entire stock available at the time). Overnight, continuing improvement in the discomfort upper thigh. Swelling over the distal extremity has resolved, pain is remarkably better. Patient is yet to get out of the bed for any length of time though. He over the affected area obtained during the emergency room stay did not demonstrate any large hematoma presence. Hemoglobin remains relatively stable overnight with a 0.4 g/dL drop without IV fluid supplementation.     Past Medical History:  Diagnosis Date  . HCV (hepatitis C virus)   . Hemophilia B (HCC)   . Left leg swelling 03/2017  . OSA (obstructive sleep apnea)   :  Past Surgical History:  Procedure Laterality Date  . TONSILLECTOMY    :   Current Facility-Administered Medications:  .  acetaminophen (TYLENOL) tablet 650 mg, 650 mg, Oral, Q6H PRN **OR** acetaminophen (TYLENOL) suppository 650 mg, 650 mg, Rectal, Q6H PRN, Lorretta Harp, MD .  Melene Muller ON 04/02/2017] Influenza vac split quadrivalent PF (FLUARIX) injection 0.5 mL, 0.5 mL,  Intramuscular, Tomorrow-1000, Niu, Xilin, MD .  loratadine (CLARITIN) tablet 10 mg, 10 mg, Oral, Daily, Lorretta Harp, MD, 10 mg at 04/01/17 1610 .  Melatonin TABS 3 mg, 3 mg, Oral, QHS, Lorretta Harp, MD, 3 mg at 04/01/17 0147 .  multivitamin with minerals tablet 1 tablet, 1 tablet, Oral, Daily, Lorretta Harp, MD, 1 tablet at 04/01/17 [id number] .  ondansetron (ZOFRAN) tablet 4 mg, 4 mg, Oral, Q6H PRN **OR** ondansetron (ZOFRAN) injection 4 mg, 4 mg, Intravenous, Q6H PRN, Lorretta Harp, MD .  vitamin C (ASCORBIC ACID) tablet 500 mg, 500 mg, Oral, Daily, Lorretta Harp, MD, 500 mg at 04/01/17 5409:  . [START ON 04/02/2017] Influenza vac split quadrivalent PF  0.5 mL Intramuscular Tomorrow-1000  . loratadine  10 mg Oral Daily  . Melatonin  3 mg Oral QHS  . multivitamin with minerals  1 tablet Oral Daily  . vitamin C  500 mg Oral Daily  :  Allergies  Allergen Reactions  . Penicillins Anaphylaxis    Parents told him that he would go into anaphylactic shock if he takes it Has patient had a PCN reaction causing immediate rash, facial/tongue/throat swelling, SOB or lightheadedness with hypotension: Yes Has patient had a PCN reaction causing severe rash involving mucus membranes or skin necrosis: No Has patient had a PCN reaction that required hospitalization: No Has patient had a PCN reaction occurring within the last 10 years: No If all of the above answers are "NO", then may proceed with Cephal  . Aspirin Other (See Comments)    Hemophiliac    . Morphine And Related Other (See  Comments)    Patient has a very high tolerance to Morphine  . Prednisone Nausea And Vomiting    Severe vomiting    :  FH: Mother & brother with Hemophilia B  SOCIAL HISTORY:  Social History   Social History  . Marital status: Married    Spouse name: N/A  . Number of children: N/A  . Years of education: N/A   Occupational History  . Not on file.   Social History Main Topics  . Smoking status: Never Smoker  . Smokeless  tobacco: Never Used  . Alcohol use No  . Drug use: No  . Sexual activity: Yes   Other Topics Concern  . Not on file   Social History Narrative   Engaged.   Works as an Camera operator.   Enjoys bowling and playing chess.        Review of Systems: As per history of present illness. All other systems are negative  Physical Exam:  Blood pressure (!) 106/59, pulse 81, temperature 98.2 F (36.8 C), temperature source Oral, resp. rate 18, height 6' (1.829 m), weight (!) 309 lb 6.4 oz (140.3 kg), SpO2 97 %.  Patient is alert, awake, oriented 3, no acute distress. HEENT: EOMI, PERRLA, moist mucous membranes. Anicteric sclerae Lungs: Clear to auscultation bilaterally but no expiratory wheezing, crackles, or rails. Cardiac: S1/S2, regular, no murmurs, rubs, gallops. Abdomen: Soft, nontender, nondistended. Bowel sounds are normoactive. No hepatosplenomegaly. Vascular: Distal lower extremities with palpable pulses, warm, well perfused. Ecchymosis noted in the medial proximal portion of the left lateral leg extending across the knee joint into the distal left lateral thigh. No palpable fluctuant areas. Lymph nodes: No palpable lymphadenopathy. Neurologic: No gross focal neurological deficits Skin: No skin rash. Musculoskeletal: No obvious distal lower extremity swelling. No gross asymmetry of the lower extremities at this time  LABS:   Recent Labs  03/31/17 1928 04/01/17 0529  WBC 7.3 6.8  HGB 11.9* 11.5*  HCT 36.3* 34.5*  PLT 123* 125*     Recent Labs  03/31/17 1928 04/01/17 0529  NA 136 136  K 4.2 3.8  CL 102 104  CO2 24 22  GLUCOSE 92 91  BUN 11 10  CREATININE 1.06 0.89  CALCIUM 9.2 8.6*      RADIOLOGY:  Ct Extremity Lower Left W Contrast  Result Date: 03/31/2017 CLINICAL DATA:  Hamstring injury. EXAM: CT OF THE LOWER LEFT EXTREMITY WITH CONTRAST TECHNIQUE: Multidetector CT imaging of the lower left extremity was performed according to the standard  protocol following intravenous contrast administration. COMPARISON:  Left femur x-rays from same day. CONTRAST:  < 100 cc > ISOVUE-300 IOPAMIDOL (ISOVUE-300) INJECTION 61% FINDINGS: Bones/Joint/Cartilage There is motion artifact through the lower leg. No acute fracture or malalignment. Joint spaces are preserved. Bone mineralization is normal. Ligaments Suboptimally assessed by CT. Muscles and Tendons There is extensive edema and hemorrhage within the posterior compartment of the thigh, involving the semimembranosus, semitendinosis, and biceps femoris muscles. The proximal tendon origins are not well evaluated due to streak artifact and technique. The distal tendons appear intact. Soft tissues Soft tissue edema along the posterior thigh and extending along the lateral aspect of the proximal lower leg. Patent vasculature without evidence of contrast extravasation. IMPRESSION: 1. Extensive edema and hemorrhage within the posterior compartment of the thigh, involving all three hamstring muscles, concerning for high-grade injury. The proximal tendon origins are not well evaluated due to streak artifact and technique. Nonemergent MRI is recommended for better characterization of the  degree of injury. Electronically Signed   By: Obie Dredge M.D.   On: 03/31/2017 22:08   Dg Femur Min 2 Views Left  Result Date: 03/31/2017 CLINICAL DATA:  Left leg swelling.  Hemophilia. EXAM: LEFT FEMUR 2 VIEWS COMPARISON:  None. FINDINGS: There is no evidence of fracture or other focal bone lesions. Soft tissues are unremarkable. IMPRESSION: Negative. Electronically Signed   By: Deatra Robinson M.D.   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  Hemoglobin remains stable at this time. Patient has been replaced with 62% value of factor VIII yesterday. Pretreatment or posttreatment factor IX levels are not currently available and will not be driving until Saturday to earliest. Symptomatically, patient is doing significantly better with no evidence of compartment syndrome or hemoglobin instability.  Recommendations: --Treat with BeneFIX 100U/kg today and tomorrow. --Agree with imaging plans by Orthopaedic Surgery --If Hgb is stable and no new symptoms, OK to d/c home tomorrow. I will f/u with him on Monday in [facility] and make a referral to resume care with the appropriate Hem[facility] Center at [facility] or [place].   Daisy Blossom, MD 04/01/2017, 2:26 PM

## 2017-04-01 NOTE — Progress Notes (Signed)
Patient arrived to unit via bed from the emergency department.  Patient is alert and oriented.  No complaints of pain.  Skin assessment complete.  IV intact to the right forearm.  Scabs located on the right leg and bruising on the left thigh/calf area.  Educated the patient on how to reach the staff.  Lowered the bed and placed the call light within reach.  Will continue to monitor the patient.

## 2017-04-01 NOTE — Consult Note (Signed)
Reason for Consult:Hamstring injury Referring Physician: J Hikaru Delorenzo is an 36 y.o. male with hemophilia B. HPI: Mary was running around the bases at a kid's baseball practice when he had sudden pain in his posterior left thigh. He was able to continue running. This happened on Sunday. He had continued leg swelling and tightness and, given his hemophilia, came in for assessment. He was admitted to IM. A CT scan showed significant hemorrhage and swelling in the posterior thigh and orthopedic surgery was consulted. The CT was technically very poor. He denies pain except mild when walking. He denies N/T.  Past Medical History:  Diagnosis Date  . HCV (hepatitis C virus)   . Hemophilia B ([place])   . Left leg swelling 03/2017  . OSA (obstructive sleep apnea)     Past Surgical History:  Procedure Laterality Date  . TONSILLECTOMY      Family History  Problem Relation Age of Onset  . COPD Mother   . Diabetes Father   . Heart disease Father   . Lung cancer Maternal Grandmother   . Lung cancer Maternal Grandfather     Social History:  reports that he has never smoked. He has never used smokeless tobacco. He reports that he does not drink alcohol or use drugs.  Allergies:  Allergies  Allergen Reactions  . Penicillins Anaphylaxis    Parents told him that he would go into anaphylactic shock if he takes it Has patient had a PCN reaction causing immediate rash, facial/tongue/throat swelling, SOB or lightheadedness with hypotension: Yes Has patient had a PCN reaction causing severe rash involving mucus membranes or skin necrosis: No Has patient had a PCN reaction that required hospitalization: No Has patient had a PCN reaction occurring within the last 10 years: No If all of the above answers are "NO", then may proceed with Cephal  . Aspirin Other (See Comments)    Hemophiliac    . Morphine And Related Other (See Comments)    Patient has a very high tolerance to  Morphine  . Prednisone Nausea And Vomiting    Severe vomiting      Medications: I have reviewed the patient's current medications.  Results for orders placed or performed during the hospital encounter of 03/31/17 (from the past 48 hour(s))  CBC with Differential     Status: Abnormal   Collection Time: 03/31/17  7:28 PM  Result Value Ref Range   WBC 7.3 4.0 - 10.5 K/uL   RBC 4.02 (L) 4.22 - 5.81 MIL/uL   Hemoglobin 11.9 (L) 13.0 - 17.0 g/dL   HCT 36.3 (L) 39.0 - 52.0 %   MCV 90.3 78.0 - 100.0 fL   MCH 29.6 26.0 - 34.0 pg   MCHC 32.8 30.0 - 36.0 g/dL   RDW 12.9 11.5 - 15.5 %   Platelets 123 (L) 150 - 400 K/uL   Neutrophils Relative % 62 %   Neutro Abs 4.5 1.7 - 7.7 K/uL   Lymphocytes Relative 27 %   Lymphs Abs 2.0 0.7 - 4.0 K/uL   Monocytes Relative 10 %   Monocytes Absolute 0.7 0.1 - 1.0 K/uL   Eosinophils Relative 1 %   Eosinophils Absolute 0.0 0.0 - 0.7 K/uL   Basophils Relative 0 %   Basophils Absolute 0.0 0.0 - 0.1 K/uL  Comprehensive metabolic panel     Status: Abnormal   Collection Time: 03/31/17  7:28 PM  Result Value Ref Range   Sodium 136 135 - 145 mmol/L  Potassium 4.2 3.5 - 5.1 mmol/L   Chloride 102 101 - 111 mmol/L   CO2 24 22 - 32 mmol/L   Glucose, Bld 92 65 - 99 mg/dL   BUN 11 6 - 20 mg/dL   Creatinine, Ser 1.06 0.61 - 1.24 mg/dL   Calcium 9.2 8.9 - 10.3 mg/dL   Total Protein 7.3 6.5 - 8.1 g/dL   Albumin 4.1 3.5 - 5.0 g/dL   AST 23 15 - 41 U/L   ALT 16 (L) 17 - 63 U/L   Alkaline Phosphatase 41 38 - 126 U/L   Total Bilirubin 1.1 0.3 - 1.2 mg/dL   GFR calc non Af Amer >60 >60 mL/min   GFR calc Af Amer >60 >60 mL/min    Comment: (NOTE) The eGFR has been calculated using the CKD EPI equation. This calculation has not been validated in all clinical situations. eGFR's persistently <60 mL/min signify possible Chronic Kidney Disease.    Anion gap 10 5 - 15  Protime-INR     Status: None   Collection Time: 04/01/17  1:03 AM  Result Value Ref Range    Prothrombin Time 14.0 11.4 - 15.2 seconds   INR 1.09   APTT     Status: None   Collection Time: 04/01/17  1:03 AM  Result Value Ref Range   aPTT 35 24 - 36 seconds  Type and screen [place]     Status: None   Collection Time: 04/01/17  1:03 AM  Result Value Ref Range   ABO/RH(D) A POS    Antibody Screen NEG    Sample Expiration 04/04/2017   ABO/Rh     Status: None   Collection Time: 04/01/17  1:03 AM  Result Value Ref Range   ABO/RH(D) A POS   CBC     Status: Abnormal   Collection Time: 04/01/17  5:29 AM  Result Value Ref Range   WBC 6.8 4.0 - 10.5 K/uL   RBC 3.83 (L) 4.22 - 5.81 MIL/uL   Hemoglobin 11.5 (L) 13.0 - 17.0 g/dL   HCT 34.5 (L) 39.0 - 52.0 %   MCV 90.1 78.0 - 100.0 fL   MCH 30.0 26.0 - 34.0 pg   MCHC 33.3 30.0 - 36.0 g/dL   RDW 13.3 11.5 - 15.5 %   Platelets 125 (L) 150 - 400 K/uL  Basic metabolic panel     Status: Abnormal   Collection Time: 04/01/17  5:29 AM  Result Value Ref Range   Sodium 136 135 - 145 mmol/L   Potassium 3.8 3.5 - 5.1 mmol/L   Chloride 104 101 - 111 mmol/L   CO2 22 22 - 32 mmol/L   Glucose, Bld 91 65 - 99 mg/dL   BUN 10 6 - 20 mg/dL   Creatinine, Ser 0.89 0.61 - 1.24 mg/dL   Calcium 8.6 (L) 8.9 - 10.3 mg/dL   GFR calc non Af Amer >60 >60 mL/min   GFR calc Af Amer >60 >60 mL/min    Comment: (NOTE) The eGFR has been calculated using the CKD EPI equation. This calculation has not been validated in all clinical situations. eGFR's persistently <60 mL/min signify possible Chronic Kidney Disease.    Anion gap 10 5 - 15    Ct Extremity Lower Left W Contrast  Result Date: 03/31/2017 CLINICAL DATA:  Hamstring injury. EXAM: CT OF THE LOWER LEFT EXTREMITY WITH CONTRAST TECHNIQUE: Multidetector CT imaging of the lower left extremity was performed according to the standard protocol following  intravenous contrast administration. COMPARISON:  Left femur x-rays from same day. CONTRAST:  < 100 cc > ISOVUE-300 IOPAMIDOL  (ISOVUE-300) INJECTION 61% FINDINGS: Bones/Joint/Cartilage There is motion artifact through the lower leg. No acute fracture or malalignment. Joint spaces are preserved. Bone mineralization is normal. Ligaments Suboptimally assessed by CT. Muscles and Tendons There is extensive edema and hemorrhage within the posterior compartment of the thigh, involving the semimembranosus, semitendinosis, and biceps femoris muscles. The proximal tendon origins are not well evaluated due to streak artifact and technique. The distal tendons appear intact. Soft tissues Soft tissue edema along the posterior thigh and extending along the lateral aspect of the proximal lower leg. Patent vasculature without evidence of contrast extravasation. IMPRESSION: 1. Extensive edema and hemorrhage within the posterior compartment of the thigh, involving all three hamstring muscles, concerning for high-grade injury. The proximal tendon origins are not well evaluated due to streak artifact and technique. Nonemergent MRI is recommended for better characterization of the degree of injury. Electronically Signed   By: Titus Dubin M.D.   On: 03/31/2017 22:08   Dg Femur Min 2 Views Left  Result Date: 03/31/2017 CLINICAL DATA:  Left leg swelling.  Hemophilia. EXAM: LEFT FEMUR 2 VIEWS COMPARISON:  None. FINDINGS: There is no evidence of fracture or other focal bone lesions. Soft tissues are unremarkable. IMPRESSION: Negative. Electronically Signed   By: Ulyses Jarred M.D.   On: 03/31/2017 19:09    Review of Systems  Constitutional: Negative for weight loss.  HENT: Negative for ear discharge, ear pain, hearing loss and tinnitus.   Eyes: Negative for blurred vision, double vision, photophobia and pain.  Respiratory: Negative for cough, sputum production and shortness of breath.   Cardiovascular: Negative for chest pain.  Gastrointestinal: Negative for abdominal pain, nausea and vomiting.  Genitourinary: Negative for dysuria, flank pain,  frequency and urgency.  Musculoskeletal: Positive for myalgias (Left thigh (tightness)). Negative for back pain, falls, joint pain and neck pain.  Neurological: Negative for dizziness, tingling, sensory change, focal weakness, loss of consciousness and headaches.  Endo/Heme/Allergies: Does not bruise/bleed easily.  Psychiatric/Behavioral: Negative for depression, memory loss and substance abuse. The patient is not nervous/anxious.    Blood pressure (!) 106/59, pulse 81, temperature 98.2 F (36.8 C), temperature source Oral, resp. rate 18, height 6' (1.829 m), weight (!) 140.3 kg (309 lb 6.4 oz), SpO2 97 %. Physical Exam  Constitutional: He appears well-developed and well-nourished. No distress.  HENT:  Head: Normocephalic.  Eyes: Conjunctivae are normal. Right eye exhibits no discharge. Left eye exhibits no discharge. No scleral icterus.  Cardiovascular: Normal rate and regular rhythm.   Respiratory: Effort normal. No respiratory distress.  Musculoskeletal:  LLE No traumatic wounds or rash, posterior thigh ecchymoses extending to proximal calf, knee flexion strength 4/5 compared to 5/5 on right, not painful. Thigh soft and nontender.  Nontender  No knee or ankle effusion  Knee stable to varus/ valgus and anterior/posterior stress  Sens DPN, SPN, TN intact  Motor EHL, ext, flex, evers 5/5  DP 2+, PT 2+, No significant edema   Neurological: He is alert.  Skin: Skin is warm and dry. He is not diaphoretic.  Psychiatric: He has a normal mood and affect. His behavior is normal.    Assessment/Plan: Left hamstring injury -- I am not worried about compartment syndrome. He has actually had a thigh compartment syndrome in the contralateral leg after an injury in the [place] years ago so is familiar with the signs/symptoms. Given his relative weakness in  that leg, however, I am worried that he might have proximal tendon disruption(s). Agree with MRI to assess though this can be done as an  outpatient. Given his mild pain I think he can safely WBAT but no running. Recommend ice and NSAID's (if not contraindicated with his hemophilia) and he can f/u with Dr. Percell Miller once MRI complete. May also benefit from compression (e.g. Bike shorts) garment or ACE wrap.    Lisette Abu, PA-C Orthopedic Surgery [phone] 04/01/2017, 12:24 PM

## 2017-04-01 NOTE — Progress Notes (Signed)
PROGRESS NOTE    Chris Winters  RUE:[id number] DOB: 1980/08/14 DOA: 03/31/2017 PCP: Doreene Nest, NP   Outpatient Specialists:     Brief Narrative:  Chris Winters is a 36 y.o. male with medical history significant of hemophilia B, chronic HCV, OSA not on CPAP, who presents with left leg swelling and tightness.  Pt states that he had pulled his hamstring while playing baseball 3 days ago. He developed swelling and tightness in the posterior left thigh. Getting 3 days of factor IX--- should be able to be d/c'd after infusion in AM.  Will have outpatient hematology follow up and referral to [facility] for further care and then follow up with ortho as well.   Assessment & Plan:   Principal Problem:   Left leg swelling Active Problems:   Chronic hepatitis C without hepatic coma (HCC)   Hemophilia (HCC)   Anemia   Left leg swelling due to bleeding 2/2 to Hemophilia B:  -exam shows bruise but not swelling/tightness with compartment syndrome -s/p factor IX infusion on 10/3 (60%) -consulted with Dr. Gweneth Dimitri -spoke with lab-- factor 9 levels will not back back until Saturday and Monday respectively -Plan to treat with factor IX today and again tomorrow (around 12PM) -patient can d/c after-- hematology will arrange -MRI to evaluate hamstring -ortho to see as an outpatient  Chronic hepatitis C without hepatic coma (HCC):  -outpatient follow up  Anemia: Hgb 11.9. -last Hgb here was 18 (after stay at [facility] and factor replacement)   DVT prophylaxis:  SCD's  Code Status: Full Code   Family Communication: At bedside  Disposition Plan:     Consultants:   Hematology  ortho     Subjective: Feeling better  Objective: Vitals:   04/01/17 0100 04/01/17 0130 04/01/17 0303 04/01/17 0620  BP: (!) 104/57 (!) 95/48 (!) 106/54 (!) 106/59  Pulse: 85 83 88 81  Resp:   18 18  Temp:   99 F (37.2 C) 98.2 F (36.8 C)  TempSrc:   Oral Oral  SpO2: 100% 98% 100% 97%    Weight:    (!) 140.3 kg (309 lb 6.4 oz)  Height:        Intake/Output Summary (Last 24 hours) at 04/01/17 1419 Last data filed at 04/01/17 1335  Gross per 24 hour  Intake                0 ml  Output             1350 ml  Net            -1350 ml   Filed Weights   03/31/17 1655 04/01/17 0620  Weight: 135.2 kg (298 lb) (!) 140.3 kg (309 lb 6.4 oz)    Examination:  General exam: Appears calm and comfortable  Respiratory system: Clear to auscultation. Respiratory effort normal. Cardiovascular system: S1 & S2 heard, RRR. No JVD, murmurs, rubs, gallops or clicks. No pedal edema. Gastrointestinal system: Abdomen is nondistended, soft and nontender. No organomegaly or masses felt. Normal bowel sounds heard. Central nervous system: Alert and oriented. No focal neurological deficits. Extremities: Symmetric 5 x 5 power. Skin: l;eft thigh with bruising, not tight to touch Psychiatry: Judgement and insight appear normal. Mood & affect appropriate.     Data Reviewed: I have personally reviewed following labs and imaging studies  CBC:  Recent Labs Lab 03/31/17 1928 04/01/17 0529  WBC 7.3 6.8  NEUTROABS 4.5  --   HGB 11.9* 11.5*  HCT  36.3* 34.5*  MCV 90.3 90.1  PLT 123* 125*   Basic Metabolic Panel:  Recent Labs Lab 03/31/17 1928 04/01/17 0529  NA 136 136  K 4.2 3.8  CL 102 104  CO2 24 22  GLUCOSE 92 91  BUN 11 10  CREATININE 1.06 0.89  CALCIUM 9.2 8.6*   GFR: Estimated Creatinine Clearance: 168.3 mL/min (by C-G formula based on SCr of 0.89 mg/dL). Liver Function Tests:  Recent Labs Lab 03/31/17 1928  AST 23  ALT 16*  ALKPHOS 41  BILITOT 1.1  PROT 7.3  ALBUMIN 4.1   No results for input(s): LIPASE, AMYLASE in the last 168 hours. No results for input(s): AMMONIA in the last 168 hours. Coagulation Profile:  Recent Labs Lab 04/01/17 0103 04/01/17 1246  INR 1.09 1.05   Cardiac Enzymes: No results for input(s): CKTOTAL, CKMB, CKMBINDEX, TROPONINI in the  last 168 hours. BNP (last 3 results) No results for input(s): PROBNP in the last 8760 hours. HbA1C: No results for input(s): HGBA1C in the last 72 hours. CBG: No results for input(s): GLUCAP in the last 168 hours. Lipid Profile: No results for input(s): CHOL, HDL, LDLCALC, TRIG, CHOLHDL, LDLDIRECT in the last 72 hours. Thyroid Function Tests: No results for input(s): TSH, T4TOTAL, FREET4, T3FREE, THYROIDAB in the last 72 hours. Anemia Panel: No results for input(s): VITAMINB12, FOLATE, FERRITIN, TIBC, IRON, RETICCTPCT in the last 72 hours. Urine analysis: No results found for: COLORURINE, APPEARANCEUR, LABSPEC, PHURINE, GLUCOSEU, HGBUR, BILIRUBINUR, KETONESUR, PROTEINUR, UROBILINOGEN, NITRITE, LEUKOCYTESUR   )No results found for this or any previous visit (from the past 240 hour(s)).    Anti-infectives    None       Radiology Studies: Ct Extremity Lower Left W Contrast  Result Date: 03/31/2017 CLINICAL DATA:  Hamstring injury. EXAM: CT OF THE LOWER LEFT EXTREMITY WITH CONTRAST TECHNIQUE: Multidetector CT imaging of the lower left extremity was performed according to the standard protocol following intravenous contrast administration. COMPARISON:  Left femur x-rays from same day. CONTRAST:  < 100 cc > ISOVUE-300 IOPAMIDOL (ISOVUE-300) INJECTION 61% FINDINGS: Bones/Joint/Cartilage There is motion artifact through the lower leg. No acute fracture or malalignment. Joint spaces are preserved. Bone mineralization is normal. Ligaments Suboptimally assessed by CT. Muscles and Tendons There is extensive edema and hemorrhage within the posterior compartment of the thigh, involving the semimembranosus, semitendinosis, and biceps femoris muscles. The proximal tendon origins are not well evaluated due to streak artifact and technique. The distal tendons appear intact. Soft tissues Soft tissue edema along the posterior thigh and extending along the lateral aspect of the proximal lower leg. Patent  vasculature without evidence of contrast extravasation. IMPRESSION: 1. Extensive edema and hemorrhage within the posterior compartment of the thigh, involving all three hamstring muscles, concerning for high-grade injury. The proximal tendon origins are not well evaluated due to streak artifact and technique. Nonemergent MRI is recommended for better characterization of the degree of injury. Electronically Signed   By: Obie Dredge M.D.   On: 03/31/2017 22:08   Dg Femur Min 2 Views Left  Result Date: 03/31/2017 CLINICAL DATA:  Left leg swelling.  Hemophilia. EXAM: LEFT FEMUR 2 VIEWS COMPARISON:  None. FINDINGS: There is no evidence of fracture or other focal bone lesions. Soft tissues are unremarkable. IMPRESSION: Negative. Electronically Signed   By: Deatra Robinson M.D.   On: 03/31/2017 19:09        Scheduled Meds: . [START ON 04/02/2017] Influenza vac split quadrivalent PF  0.5 mL Intramuscular Tomorrow-1000  .  loratadine  10 mg Oral Daily  . Melatonin  3 mg Oral QHS  . multivitamin with minerals  1 tablet Oral Daily  . vitamin C  500 mg Oral Daily   Continuous Infusions:   LOS: 0 days    Time spent: 35 min    Orlan Aversa U Airyonna Franklyn, DO [facility] Pager [phone]  If 7PM-7AM, please contact night-coverage [email or web address] Password [email or web address] 04/01/2017, 2:19 PM

## 2017-04-02 ENCOUNTER — Other Ambulatory Visit: Payer: Self-pay

## 2017-04-02 DIAGNOSIS — B182 Chronic viral hepatitis C: Secondary | ICD-10-CM | POA: Diagnosis present

## 2017-04-02 DIAGNOSIS — Z9889 Other specified postprocedural states: Secondary | ICD-10-CM | POA: Diagnosis not present

## 2017-04-02 DIAGNOSIS — M79652 Pain in left thigh: Secondary | ICD-10-CM | POA: Diagnosis present

## 2017-04-02 DIAGNOSIS — Z880 Allergy status to penicillin: Secondary | ICD-10-CM | POA: Diagnosis not present

## 2017-04-02 DIAGNOSIS — Z801 Family history of malignant neoplasm of trachea, bronchus and lung: Secondary | ICD-10-CM | POA: Diagnosis not present

## 2017-04-02 DIAGNOSIS — Y929 Unspecified place or not applicable: Secondary | ICD-10-CM | POA: Diagnosis not present

## 2017-04-02 DIAGNOSIS — Z23 Encounter for immunization: Secondary | ICD-10-CM | POA: Diagnosis not present

## 2017-04-02 DIAGNOSIS — Z825 Family history of asthma and other chronic lower respiratory diseases: Secondary | ICD-10-CM | POA: Diagnosis not present

## 2017-04-02 DIAGNOSIS — Z79899 Other long term (current) drug therapy: Secondary | ICD-10-CM | POA: Diagnosis not present

## 2017-04-02 DIAGNOSIS — M7989 Other specified soft tissue disorders: Secondary | ICD-10-CM | POA: Diagnosis present

## 2017-04-02 DIAGNOSIS — Y9364 Activity, baseball: Secondary | ICD-10-CM | POA: Diagnosis not present

## 2017-04-02 DIAGNOSIS — D649 Anemia, unspecified: Secondary | ICD-10-CM | POA: Diagnosis present

## 2017-04-02 DIAGNOSIS — Z888 Allergy status to other drugs, medicaments and biological substances status: Secondary | ICD-10-CM | POA: Diagnosis not present

## 2017-04-02 DIAGNOSIS — D66 Hereditary factor VIII deficiency: Secondary | ICD-10-CM

## 2017-04-02 DIAGNOSIS — Z8249 Family history of ischemic heart disease and other diseases of the circulatory system: Secondary | ICD-10-CM | POA: Diagnosis not present

## 2017-04-02 DIAGNOSIS — S76812A Strain of other specified muscles, fascia and tendons at thigh level, left thigh, initial encounter: Secondary | ICD-10-CM | POA: Diagnosis present

## 2017-04-02 DIAGNOSIS — Z833 Family history of diabetes mellitus: Secondary | ICD-10-CM | POA: Diagnosis not present

## 2017-04-02 DIAGNOSIS — D67 Hereditary factor IX deficiency: Secondary | ICD-10-CM | POA: Diagnosis present

## 2017-04-02 DIAGNOSIS — S7012XA Contusion of left thigh, initial encounter: Secondary | ICD-10-CM | POA: Diagnosis present

## 2017-04-02 DIAGNOSIS — G4733 Obstructive sleep apnea (adult) (pediatric): Secondary | ICD-10-CM | POA: Diagnosis present

## 2017-04-02 DIAGNOSIS — Z885 Allergy status to narcotic agent status: Secondary | ICD-10-CM | POA: Diagnosis not present

## 2017-04-02 LAB — BASIC METABOLIC PANEL
Anion gap: 7 (ref 5–15)
BUN: 14 mg/dL (ref 6–20)
CO2: 27 mmol/L (ref 22–32)
CREATININE: 0.89 mg/dL (ref 0.61–1.24)
Calcium: 8.7 mg/dL — ABNORMAL LOW (ref 8.9–10.3)
Chloride: 102 mmol/L (ref 101–111)
GFR calc Af Amer: >60 mL/min (ref >60)
GLUCOSE: 95 mg/dL (ref 65–99)
POTASSIUM: 3.9 mmol/L (ref 3.5–5.1)
Sodium: 136 mmol/L (ref 135–145)

## 2017-04-02 LAB — CBC
HCT: 35.3 % — ABNORMAL LOW (ref 39.0–52.0)
Hemoglobin: 11.8 g/dL — ABNORMAL LOW (ref 13.0–17.0)
MCH: 30.3 pg (ref 26.0–34.0)
MCHC: 33.4 g/dL (ref 30.0–36.0)
MCV: 90.7 fL (ref 78.0–100.0)
PLATELETS: 130 10*3/uL — AB (ref 150–400)
RBC: 3.89 MIL/uL — AB (ref 4.22–5.81)
RDW: 13 % (ref 11.5–15.5)
WBC: 6.1 10*3/uL (ref 4.0–10.5)

## 2017-04-02 LAB — FACTOR 9 ASSAY: Coagulation Factor IX: 4 % — ABNORMAL LOW (ref 60–177)

## 2017-04-02 NOTE — Consult Note (Signed)
IP PROGRESS NOTE  Subjective:  Patient reports that he was able to ambulate last night without significant pain or discomfort in the left lower extremity. The thigh feels significantly less tight or uncomfortable compared to the day prior. No interval fevers.   Objective: Vital signs in last 24 hours: Blood pressure 113/70, pulse 75, temperature 97.6 F (36.4 C), temperature source Oral, resp. rate 19, height 6' (1.829 m), weight (!) 309 lb 6.4 oz (140.3 kg), SpO2 100 %.  Intake/Output from previous day: 10/04 0701 - 10/05 0700 In: -  Out: 1350 [Urine:1350]  Physical Exam:  GEN: Patient is awake, alert, oriented 3. No apparent distress. Extremities: Expansion of the visible ecchymosis over the posterior lateral distal thigh and upper leg on the left without associated induration, tenderness, or palpable hematoma.  Lab Results:  Recent Labs  04/01/17 0529 04/02/17 0544  WBC 6.8 6.1  HGB 11.5* 11.8*  HCT 34.5* 35.3*  PLT 125* 130*    BMET  Recent Labs  04/01/17 0529 04/02/17 0544  NA 136 136  K 3.8 3.9  CL 104 102  CO2 22 27  GLUCOSE 91 95  BUN 10 14  CREATININE 0.89 0.89  CALCIUM 8.6* 8.7*    No results found for: CEA1  Studies/Results: Ct Extremity Lower Left W Contrast  Result Date: 03/31/2017 CLINICAL DATA:  Hamstring injury. EXAM: CT OF THE LOWER LEFT EXTREMITY WITH CONTRAST TECHNIQUE: Multidetector CT imaging of the lower left extremity was performed according to the standard protocol following intravenous contrast administration. COMPARISON:  Left femur x-rays from same day. CONTRAST:  < 100 cc > ISOVUE-300 IOPAMIDOL (ISOVUE-300) INJECTION 61% FINDINGS: Bones/Joint/Cartilage There is motion artifact through the lower leg. No acute fracture or malalignment. Joint spaces are preserved. Bone mineralization is normal. Ligaments Suboptimally assessed by CT. Muscles and Tendons There is extensive edema and hemorrhage within the posterior compartment of the thigh,  involving the semimembranosus, semitendinosis, and biceps femoris muscles. The proximal tendon origins are not well evaluated due to streak artifact and technique. The distal tendons appear intact. Soft tissues Soft tissue edema along the posterior thigh and extending along the lateral aspect of the proximal lower leg. Patent vasculature without evidence of contrast extravasation. IMPRESSION: 1. Extensive edema and hemorrhage within the posterior compartment of the thigh, involving all three hamstring muscles, concerning for high-grade injury. The proximal tendon origins are not well evaluated due to streak artifact and technique. Nonemergent MRI is recommended for better characterization of the degree of injury. Electronically Signed   By: Obie Dredge M.D.   On: 03/31/2017 22:08   Dg Femur Min 2 Views Left  Result Date: 03/31/2017 CLINICAL DATA:  Left leg swelling.  Hemophilia. EXAM: LEFT FEMUR 2 VIEWS COMPARISON:  None. FINDINGS: There is no evidence of fracture or other focal bone lesions. Soft tissues are unremarkable. IMPRESSION: Negative. Electronically Signed   By: Deatra Robinson M.D.   On: 03/31/2017 19:09    Medications: I have reviewed the patient's current medications.  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 Lost to follow-up due to infrequent symptoms in adulthood presenting only with traumatic injury. Currently admitted due to the left lower extremity soft tissue bleeding following injury during a baseball game. Hemoglobin improving compared to admission making active ongoing bleeding less likely. Patient has been replaced with 62% value of factor VIII on admission and received 100% replacement yesterday. Pretreatment or posttreatment factor IX levels are  not currently available and will not be arriving until Saturday to earliest. Symptomatically, patient is doing significantly better with no  evidence of compartment syndrome or hemoglobin instability.  Recommendations: --Treat with BeneFIX 100U/kg today --Discharge at discretion of orthopedic surgery and internal medicine --Follow-up arranged in my clinic on Monday for evaluation --Ultimately, patient will be reconnected to [facility] hemophilia program.   LOS: 0 days   Daisy Blossom, MD   04/02/2017, 7:27 AM

## 2017-04-02 NOTE — Discharge Summary (Signed)
Physician Discharge Summary  Chris Winters EAV:[id number] DOB: Nov 09, 1980 DOA: 03/31/2017  PCP: Doreene Nest, NP  Admit date: 03/31/2017 Discharge date: 04/02/2017  Time spent: 35 minutes  Recommendations for Outpatient Follow-up:  1. Hematology Dr.Perlov on 10/8 2. Ortho Dr.Murphy in 1 month   Discharge Diagnoses:  Principal Problem:   Left thigh hematoma   Left hamstring partial tear   Chronic hepatitis C without hepatic coma (HCC)   Hemophilia B (HCC)   Anemia   Leg swelling   Discharge Condition: stable  Diet recommendation: regular  Filed Weights   03/31/17 1655 04/01/17 0620  Weight: 135.2 kg (298 lb) (!) 140.3 kg (309 lb 6.4 oz)    History of present illness:  Chris Winters a 36 y.o.malewith medical history significant of hemophilia B, chronic HCV, OSA not on CPAP, who presented with left leg swelling and tightness. Pt stated that he had pulled hishamstring while playing baseball 3 days ago. He developed swelling and tightness in the posterior left thigh  Hospital Course:   Left thigh hematoma -due to bleeding 2/2 to Hemophilia B and L hamstring partial tear -hematology consulted, he was given 3 doses of factor IX infusion on 3days 10/3-10/5 -L thigh much improved now -d/w lab-- factor 9 levels will not back back until Saturday and Monday respectively -per Heme, he will FU with patient in office on Monday and refer to [facility] -advised to stop NSAIDs  L Hamstring partial tear -seen by Ortho, felt that should recover with conservative mgt, and FU with ortho and PT  Chronic hepatitis C without hepatic coma (HCC): -outpatient follow up  Anemia:Hgb 11.9. -stable now  Consultations:  Heme  Ortho  Discharge Exam: Vitals:   04/02/17 0500 04/02/17 1348  BP: 113/70 130/70  Pulse: 75 84  Resp: 19 18  Temp: 97.6 F (36.4 C) 99 F (37.2 C)  SpO2: 100% 98%    General: AAOx3 Cardiovascular: S1S2/RRR Respiratory: CTAB  Discharge  Instructions    Current Discharge Medication List    CONTINUE these medications which have NOT CHANGED   Details  Ascorbic Acid (VITAMIN C PO) Take 1 tablet by mouth daily.    BIOTIN PO Take 1 tablet by mouth daily.    cetirizine (ZYRTEC) 10 MG tablet Take 10 mg by mouth daily.    Melatonin 5 MG TABS Take 5 mg by mouth at bedtime.    Multiple Vitamins-Minerals (CENTRUM MEN) TABS Take 1 tablet by mouth daily.      STOP taking these medications     ibuprofen (ADVIL,MOTRIN) 200 MG tablet        Allergies  Allergen Reactions  . Penicillins Anaphylaxis    Parents told him that he would go into anaphylactic shock if he takes it Has patient had a PCN reaction causing immediate rash, facial/tongue/throat swelling, SOB or lightheadedness with hypotension: Yes Has patient had a PCN reaction causing severe rash involving mucus membranes or skin necrosis: No Has patient had a PCN reaction that required hospitalization: No Has patient had a PCN reaction occurring within the last 10 years: No If all of the above answers are "NO", then may proceed with Cephal  . Aspirin Other (See Comments)    Hemophiliac    . Morphine And Related Other (See Comments)    Patient has a very high tolerance to Morphine  . Prednisone Nausea And Vomiting    Severe vomiting     Follow-up Information    Sheral Apley, MD. Schedule an appointment  as soon as possible for a visit in 2 week(s).   Specialty:  Orthopedic Surgery Contact information: [address] [place] [address] [phone]        Daisy Blossom, MD Follow up on 04/05/2017.   Specialty:  Hematology and Oncology Contact information: [address] [place] [address] [phone]            The results of significant diagnostics from this hospitalization (including imaging, microbiology, ancillary and laboratory) are listed below for reference.    Significant Diagnostic Studies: Mr Femur Left Wo  Contrast  Result Date: 04/02/2017 CLINICAL DATA:  Left hamstring injury. Left leg swelling and tightness. EXAM: MR OF THE LEFT FEMUR WITHOUT CONTRAST TECHNIQUE: Multiplanar, multisequence MR imaging of the left femur was performed. No intravenous contrast was administered. COMPARISON:  None. FINDINGS: Bones/Joint/Cartilage No marrow signal abnormality. No fracture or dislocation. Normal alignment. No joint effusion. Ligaments Collateral ligaments are intact. Muscles and Tendons Muscle edema in the left gluteus maximus muscle. Large hematoma in the proximal left semimembranosus muscle at the proximal musculotendinous junction measuring approximately 5.9 x 6.8 x 15 cm most consistent with a high-grade partial-thickness tear. Mild perifascial edema throughout the flexor compartment of the left thigh. Quadriceps tendon and patellar tendon are intact. Soft tissue No other fluid collection or hematoma.  No soft tissue mass. IMPRESSION: 1. High-grade partial-thickness tear of the proximal semimembranosus muscle at the musculotendinous junction with a 5.9 x 6.8 x 15 cm intramuscular hematoma. 2. Mild muscle edema in the left gluteus maximus muscle likely reflecting mild muscle strain. Electronically Signed   By: Elige Ko   On: 04/02/2017 09:03   Ct Extremity Lower Left W Contrast  Result Date: 03/31/2017 CLINICAL DATA:  Hamstring injury. EXAM: CT OF THE LOWER LEFT EXTREMITY WITH CONTRAST TECHNIQUE: Multidetector CT imaging of the lower left extremity was performed according to the standard protocol following intravenous contrast administration. COMPARISON:  Left femur x-rays from same day. CONTRAST:  < 100 cc > ISOVUE-300 IOPAMIDOL (ISOVUE-300) INJECTION 61% FINDINGS: Bones/Joint/Cartilage There is motion artifact through the lower leg. No acute fracture or malalignment. Joint spaces are preserved. Bone mineralization is normal. Ligaments Suboptimally assessed by CT. Muscles and Tendons There is extensive edema  and hemorrhage within the posterior compartment of the thigh, involving the semimembranosus, semitendinosis, and biceps femoris muscles. The proximal tendon origins are not well evaluated due to streak artifact and technique. The distal tendons appear intact. Soft tissues Soft tissue edema along the posterior thigh and extending along the lateral aspect of the proximal lower leg. Patent vasculature without evidence of contrast extravasation. IMPRESSION: 1. Extensive edema and hemorrhage within the posterior compartment of the thigh, involving all three hamstring muscles, concerning for high-grade injury. The proximal tendon origins are not well evaluated due to streak artifact and technique. Nonemergent MRI is recommended for better characterization of the degree of injury. Electronically Signed   By: Obie Dredge M.D.   On: 03/31/2017 22:08   Dg Femur Min 2 Views Left  Result Date: 03/31/2017 CLINICAL DATA:  Left leg swelling.  Hemophilia. EXAM: LEFT FEMUR 2 VIEWS COMPARISON:  None. FINDINGS: There is no evidence of fracture or other focal bone lesions. Soft tissues are unremarkable. IMPRESSION: Negative. Electronically Signed   By: Deatra Robinson M.D.   On: 03/31/2017 19:09    Microbiology: No results found for this or any previous visit (from the past 240 hour(s)).   Labs: Basic Metabolic Panel:  Recent Labs Lab  03/31/17 1928 04/01/17 0529 04/02/17 0544  NA 136 136 136  K 4.2 3.8 3.9  CL 102 104 102  CO2 GLUCOSE 92 91 95  BUN CREATININE 1.06 0.89 0.89  CALCIUM 9.2 8.6* 8.7*   Liver Function Tests:  Recent Labs Lab 03/31/17 1928  AST 23  ALT 16*  ALKPHOS 41  BILITOT 1.1  PROT 7.3  ALBUMIN 4.1   No results for input(s): LIPASE, AMYLASE in the last 168 hours. No results for input(s): AMMONIA in the last 168 hours. CBC:  Recent Labs Lab 03/31/17 1928 04/01/17 0529 04/02/17 0544  WBC 7.3 6.8 6.1  NEUTROABS 4.5  --   --   HGB 11.9* 11.5* 11.8*   HCT 36.3* 34.5* 35.3*  MCV 90.3 90.1 90.7  PLT 123* 125* 130*   Cardiac Enzymes: No results for input(s): CKTOTAL, CKMB, CKMBINDEX, TROPONINI in the last 168 hours. BNP: BNP (last 3 results)  Recent Labs  06/13/16 1031  BNP 17.0    ProBNP (last 3 results) No results for input(s): PROBNP in the last 8760 hours.  CBG: No results for input(s): GLUCAP in the last 168 hours.     SignedZannie Cove MD.  [facility] 04/02/2017, 2:58 PM

## 2017-04-02 NOTE — Progress Notes (Signed)
Tamala Julian to be D/C'd Home per MD order.  Discussed with the patient and all questions fully answered.  VSS, Skin clean, dry and intact without evidence of skin break down, no evidence of skin tears noted. IV catheter discontinued intact. Site without signs and symptoms of complications. Dressing and pressure applied.  An After Visit Summary was printed and given to the patient. Patient received prescription.  D/c education completed with patient/family including follow up instructions, medication list, d/c activities limitations if indicated, with other d/c instructions as indicated by MD - patient able to verbalize understanding, all questions fully answered.   Patient instructed to return to ED, call 911, or call MD for any changes in condition.   Patient escorted via WC, and D/C home via private auto.  Melvenia Needles 04/02/2017 4:33 PM

## 2017-04-02 NOTE — Progress Notes (Signed)
Patient ID: Chris Winters, male   DOB: 1980/12/10, 36 y.o.   MRN: [id number]   LOS: 0 days   Subjective: Tightness continues to improve. Was up walking yesterday with only mild difficulty.   Objective: Vital signs in last 24 hours: Temp:  [97.6 F (36.4 C)-98.6 F (37 C)] 97.6 F (36.4 C) (10/05 0500) Pulse Rate:  [75-93] 75 (10/05 0500) Resp:  [19-20] 19 (10/05 0500) BP: (109-113)/(59-72) 113/70 (10/05 0500) SpO2:  [97 %-100 %] 100 % (10/05 0500) Last BM Date: 03/31/17   Laboratory  CBC  Recent Labs  04/01/17 0529 04/02/17 0544  WBC 6.8 6.1  HGB 11.5* 11.8*  HCT 34.5* 35.3*  PLT 125* 130*   BMET  Recent Labs  04/01/17 0529 04/02/17 0544  NA 136 136  K 3.8 3.9  CL 104 102  CO2 22 27  GLUCOSE 91 95  BUN 10 14  CREATININE 0.89 0.89  CALCIUM 8.6* 8.7*    Radiology Results MR OF THE LEFT FEMUR WITHOUT CONTRAST  TECHNIQUE: Multiplanar, multisequence MR imaging of the left femur was performed. No intravenous contrast was administered.  COMPARISON:  None.  FINDINGS: Bones/Joint/Cartilage  No marrow signal abnormality. No fracture or dislocation. Normal alignment. No joint effusion.  Ligaments  Collateral ligaments are intact.  Muscles and Tendons Muscle edema in the left gluteus maximus muscle. Large hematoma in the proximal left semimembranosus muscle at the proximal musculotendinous junction measuring approximately 5.9 x 6.8 x 15 cm most consistent with a high-grade partial-thickness tear. Mild perifascial edema throughout the flexor compartment of the left thigh. Quadriceps tendon and patellar tendon are intact.  Soft tissue No other fluid collection or hematoma.  No soft tissue mass.  IMPRESSION: 1. High-grade partial-thickness tear of the proximal semimembranosus muscle at the musculotendinous junction with a 5.9 x 6.8 x 15 cm intramuscular hematoma. 2. Mild muscle edema in the left gluteus maximus muscle likely reflecting  mild muscle strain.   Electronically Signed   By: Elige Ko   On: 04/02/2017 09:03  Physical Exam General appearance: alert and no distress   Assessment/Plan: Left hamstring partial tear -- Should recover well without operative intervention. In addition to previous recommendation would suggest formal physical therapy program to start no earlier than next week. Please call with questions.    Freeman Caldron, PA-C Orthopedic Surgery [phone] 04/02/2017

## 2017-04-03 LAB — VON WILLEBRAND PANEL
COAGULATION FACTOR VIII: 145 % (ref 57–163)
Ristocetin Co-factor, Plasma: 193 % (ref 50–200)
Von Willebrand Antigen, Plasma: 197 % (ref 50–200)

## 2017-04-03 LAB — FACTOR 9 ASSAY: Coagulation Factor IX: 29 % — ABNORMAL LOW (ref 60–177)

## 2017-04-03 LAB — COAG STUDIES INTERP REPORT

## 2017-04-05 ENCOUNTER — Encounter: Payer: Self-pay | Admitting: Hematology and Oncology

## 2017-04-05 ENCOUNTER — Other Ambulatory Visit (HOSPITAL_BASED_OUTPATIENT_CLINIC_OR_DEPARTMENT_OTHER): Payer: Non-veteran care

## 2017-04-05 ENCOUNTER — Ambulatory Visit (HOSPITAL_BASED_OUTPATIENT_CLINIC_OR_DEPARTMENT_OTHER): Payer: Non-veteran care | Admitting: Hematology and Oncology

## 2017-04-05 VITALS — BP 131/80 | HR 81 | Temp 98.3°F | Resp 18 | Ht 72.0 in | Wt 317.0 lb

## 2017-04-05 DIAGNOSIS — D67 Hereditary factor IX deficiency: Secondary | ICD-10-CM

## 2017-04-05 DIAGNOSIS — D66 Hereditary factor VIII deficiency: Secondary | ICD-10-CM

## 2017-04-05 LAB — CBC WITH DIFFERENTIAL/PLATELET
BASO%: 0.6 % (ref 0.0–2.0)
Basophils Absolute: 0 10*3/uL (ref 0.0–0.1)
EOS ABS: 0.1 10*3/uL (ref 0.0–0.5)
EOS%: 1.1 % (ref 0.0–7.0)
HCT: 35.9 % — ABNORMAL LOW (ref 38.4–49.9)
HEMOGLOBIN: 12 g/dL — AB (ref 13.0–17.1)
LYMPH%: 26.7 % (ref 14.0–49.0)
MCH: 30.4 pg (ref 27.2–33.4)
MCHC: 33.4 g/dL (ref 32.0–36.0)
MCV: 91 fL (ref 79.3–98.0)
MONO#: 0.4 10*3/uL (ref 0.1–0.9)
MONO%: 8.5 % (ref 0.0–14.0)
NEUT#: 3.3 10*3/uL (ref 1.5–6.5)
NEUT%: 63.1 % (ref 39.0–75.0)
PLATELETS: 180 10*3/uL (ref 140–400)
RBC: 3.94 10*6/uL — ABNORMAL LOW (ref 4.20–5.82)
RDW: 13.1 % (ref 11.0–14.6)
WBC: 5.3 10*3/uL (ref 4.0–10.3)
lymph#: 1.4 10*3/uL (ref 0.9–3.3)

## 2017-04-05 LAB — COMPREHENSIVE METABOLIC PANEL
ALBUMIN: 3.9 g/dL (ref 3.5–5.0)
ALT: 12 U/L (ref 0–55)
ANION GAP: 6 meq/L (ref 3–11)
AST: 18 U/L (ref 5–34)
Alkaline Phosphatase: 50 U/L (ref 40–150)
BUN: 12.2 mg/dL (ref 7.0–26.0)
CHLORIDE: 107 meq/L (ref 98–109)
CO2: 26 meq/L (ref 22–29)
CREATININE: 0.8 mg/dL (ref 0.7–1.3)
Calcium: 8.9 mg/dL (ref 8.4–10.4)
Glucose: 74 mg/dl (ref 70–140)
POTASSIUM: 4.2 meq/L (ref 3.5–5.1)
SODIUM: 139 meq/L (ref 136–145)
TOTAL PROTEIN: 7.6 g/dL (ref 6.4–8.3)
Total Bilirubin: 1.46 mg/dL — ABNORMAL HIGH (ref 0.20–1.20)

## 2017-04-05 LAB — PROTIME-INR
INR: 1.1 — AB (ref 2.00–3.50)
Protime: 13.2 Seconds (ref 10.6–13.4)

## 2017-04-06 LAB — FIBRINOGEN: FIBRINOGEN: 436 mg/dL (ref 193–507)

## 2017-04-07 ENCOUNTER — Encounter: Payer: Self-pay | Admitting: Hematology and Oncology

## 2017-04-07 LAB — FACTOR 9 ASSAY: FACTOR IX ACTIVITY: 15 % — AB (ref 60–177)

## 2017-04-07 NOTE — Assessment & Plan Note (Signed)
36 y.o. 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 Symptomatically, appears to have responded well to factor IX infusions.  We have delayed results of factor levels following administration of the supplemental factor, but there are timing makes it hard to assess the actual response to the treatment. Clinically, patient is doing remarkably well. No symptomatic evidence of progressive hematoma. Functional status of the patient is improving.  The best step at this time is to refer patient to specialize hemophilia center for reestablishment of care so that he could receive regular follow-up and adequate factor supply.  Plan: --Referral to [facility] --RTC with [place] PRN

## 2017-04-07 NOTE — Progress Notes (Signed)
[facility] Follow-up Visit:  Assessment: Hemophilia B ([place]) 36 y.o. 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 Symptomatically, appears to have responded well to factor IX infusions.  We have delayed results of factor levels following administration of the supplemental factor, but there are timing makes it hard to assess the actual response to the treatment. Clinically, patient is doing remarkably well. No symptomatic evidence of progressive hematoma. Functional status of the patient is improving.  The best step at this time is to refer patient to specialize hemophilia center for reestablishment of care so that he could receive regular follow-up and adequate factor supply.  Plan: --Referral to [place] --RTC with [place] PRN  Voice recognition software was used and creation of this note. Despite my best effort at editing the text, some misspelling/errors may have occurred.  Orders Placed This Encounter  Procedures  . Ambulatory referral to Hematology / Oncology    Referral Priority:   Routine    Referral Type:   Consultation    Referral Reason:   Specialty Services Required    Referral Location:   [facility]    Requested Specialty:   Hematology    Number of Visits Requested:   1    Cancer Staging No matching staging information was found for the patient.  All questions were answered.  . The patient knows to call the clinic with any problems, questions or concerns.  This note was electronically signed.    History of Presenting Illness DEGAN HANSER is a 37 y.o. male with history of Hemophilia B with infrequent bleeding episodes with exclusively soft-tissue bleeding with injuries. Significantly more bleeding episodes during childhood, and much fewer in adulthood. Previous Hemophilia care through [facility] and [place], but lost to f/u recently.  Patient presented to the ER  last Week after he noticed progressive pain and swelling in the left hamstring. He initially felt a sharp hamstring pull while playing baseball 3 days prior. He self-medicated with ibuprofen, but the pain and swelling including the distal LE swelling have progressed. Patient was found to have a hematoma in the left thigh. In the emergency room, he has received a 60% replacement of factor IX with 2 additional doses of 100% replacement given on the subsequent days is my recommendation. Patient returns to the clinic today to assess clinical situation and to help with reestablishing care with a hemophilia center.   In the interim, patient denies any progression of the discomfort in the thigh. He is ambulation has improved and became less difficult. New neurological symptoms from the distal extremity. No new complaints otherwise.    Medical History: Past Medical History:  Diagnosis Date  . HCV (hepatitis C virus)   . Hemophilia B ([place])   . Left leg swelling 03/2017  . OSA (obstructive sleep apnea)     Surgical History: Past Surgical History:  Procedure Laterality Date  . TONSILLECTOMY      Family History: Family History  Problem Relation Age of Onset  . COPD Mother   . Hemophilia Mother   . Diabetes Father   . Heart disease Father   . Hemophilia Brother   . Lung cancer Maternal Grandmother   . Lung cancer Maternal Grandfather     Social History: Social History   Social History  . Marital status: Single    Spouse name: N/A  . Number of children: N/A  . Years of education:  N/A   Occupational History  . Not on file.   Social History Main Topics  . Smoking status: Never Smoker  . Smokeless tobacco: Never Used  . Alcohol use No  . Drug use: No  . Sexual activity: Yes   Other Topics Concern  . Not on file   Social History Narrative   Engaged.   Works as an Librarian, academic.   Enjoys bowling and playing chess.        Allergies: Allergies  Allergen Reactions  .  Penicillins Anaphylaxis    Parents told him that he would go into anaphylactic shock if he takes it Has patient had a PCN reaction causing immediate rash, facial/tongue/throat swelling, SOB or lightheadedness with hypotension: Yes Has patient had a PCN reaction causing severe rash involving mucus membranes or skin necrosis: No Has patient had a PCN reaction that required hospitalization: No Has patient had a PCN reaction occurring within the last 10 years: No If all of the above answers are "NO", then may proceed with Cephal  . Aspirin Other (See Comments)    Hemophiliac    . Morphine And Related Other (See Comments)    Patient has a very high tolerance to Morphine  . Prednisone Nausea And Vomiting    Severe vomiting      Medications:  Current Outpatient Prescriptions  Medication Sig Dispense Refill  . Ascorbic Acid (VITAMIN C PO) Take 1 tablet by mouth daily.    Marland Kitchen BIOTIN PO Take 1 tablet by mouth daily.    . cetirizine (ZYRTEC) 10 MG tablet Take 10 mg by mouth daily.    . Melatonin 5 MG TABS Take 5 mg by mouth at bedtime.    . Multiple Vitamins-Minerals (CENTRUM MEN) TABS Take 1 tablet by mouth daily.     No current facility-administered medications for this visit.     Review of Systems: Review of Systems  All other systems reviewed and are negative.    PHYSICAL EXAMINATION Blood pressure 131/80, pulse 81, temperature 98.3 F (36.8 C), temperature source Oral, resp. rate 18, height 6' (1.829 m), weight (!) 317 lb (143.8 kg), SpO2 100 %.  ECOG PERFORMANCE STATUS: 1 - Symptomatic but completely ambulatory  Physical Exam  Constitutional: He is oriented to person, place, and time and well-developed, well-nourished, and in no distress. No distress.  HENT:  Head: Normocephalic and atraumatic.  Mouth/Throat: Oropharynx is clear and moist. No oropharyngeal exudate.  Eyes: Pupils are equal, round, and reactive to light. Conjunctivae and EOM are normal. No scleral icterus.   Neck: Normal range of motion. Neck supple. No thyromegaly present.  Cardiovascular: Normal rate, regular rhythm and normal heart sounds.   No murmur heard. Pulmonary/Chest: Effort normal and breath sounds normal. No respiratory distress. He has no wheezes. He has no rales.  Abdominal: Soft. Bowel sounds are normal. He exhibits no distension. There is no tenderness. There is no rebound.  Musculoskeletal: Normal range of motion. He exhibits no edema or deformity.  Interval apparent expansion of the several ecchymosis over the posterior and lateral left thigh. No associated palpable hematoma, no muscle induration, and no decrease in pulses distally  Lymphadenopathy:    He has no cervical adenopathy.  Neurological: He is alert and oriented to person, place, and time. He has normal reflexes. No cranial nerve deficit.  Skin: Skin is warm and dry. No rash noted. He is not diaphoretic. No erythema. No pallor.     LABORATORY DATA: I have personally reviewed the data as  listed: Appointment on 04/05/2017  Component Date Value Ref Range Status  . WBC 04/05/2017 5.3  4.0 - 10.3 10e3/uL Final  . NEUT# 04/05/2017 3.3  1.5 - 6.5 10e3/uL Final  . HGB 04/05/2017 12.0* 13.0 - 17.1 g/dL Final  . HCT 04/05/2017 35.9* 38.4 - 49.9 % Final  . Platelets 04/05/2017 180  140 - 400 10e3/uL Final  . MCV 04/05/2017 91.0  79.3 - 98.0 fL Final  . MCH 04/05/2017 30.4  27.2 - 33.4 pg Final  . MCHC 04/05/2017 33.4  32.0 - 36.0 g/dL Final  . RBC 04/05/2017 3.94* 4.20 - 5.82 10e6/uL Final  . RDW 04/05/2017 13.1  11.0 - 14.6 % Final  . lymph# 04/05/2017 1.4  0.9 - 3.3 10e3/uL Final  . MONO# 04/05/2017 0.4  0.1 - 0.9 10e3/uL Final  . Eosinophils Absolute 04/05/2017 0.1  0.0 - 0.5 10e3/uL Final  . Basophils Absolute 04/05/2017 0.0  0.0 - 0.1 10e3/uL Final  . NEUT% 04/05/2017 63.1  39.0 - 75.0 % Final  . LYMPH% 04/05/2017 26.7  14.0 - 49.0 % Final  . MONO% 04/05/2017 8.5  0.0 - 14.0 % Final  . EOS% 04/05/2017 1.1  0.0 -  7.0 % Final  . BASO% 04/05/2017 0.6  0.0 - 2.0 % Final  . Sodium 04/05/2017 139  136 - 145 mEq/L Final  . Potassium 04/05/2017 4.2  3.5 - 5.1 mEq/L Final  . Chloride 04/05/2017 107  98 - 109 mEq/L Final  . CO2 04/05/2017 26  22 - 29 mEq/L Final  . Glucose 04/05/2017 74  70 - 140 mg/dl Final   Glucose reference range is for nonfasting patients. Fasting glucose reference range is 70- 100.  Marland Kitchen BUN 04/05/2017 12.2  7.0 - 26.0 mg/dL Final  . Creatinine 04/05/2017 0.8  0.7 - 1.3 mg/dL Final  . Total Bilirubin 04/05/2017 1.46* 0.20 - 1.20 mg/dL Final  . Alkaline Phosphatase 04/05/2017 50  40 - 150 U/L Final  . AST 04/05/2017 18  5 - 34 U/L Final  . ALT 04/05/2017 12  0 - 55 U/L Final  . Total Protein 04/05/2017 7.6  6.4 - 8.3 g/dL Final  . Albumin 04/05/2017 3.9  3.5 - 5.0 g/dL Final  . Calcium 04/05/2017 8.9  8.4 - 10.4 mg/dL Final  . Anion Gap 04/05/2017 6  3 - 11 mEq/L Final  . EGFR 04/05/2017 >90  >90 ml/min/1.73 m2 Final   eGFR is calculated using the CKD-EPI Creatinine Equation (2009)  . Factor IX Activity 04/05/2017 15* 60 - 177 % Final  . FIBRINOGEN 04/05/2017 436  193 - 507 mg/dL Final  . Protime 04/05/2017 13.2  10.6 - 13.4 Seconds Final  . INR 04/05/2017 1.10* 2.00 - 3.50 Final   Comment: INR is useful only to assess adequacy of anticoagulation with coumadin when comparing results from different labs. It should not be used to estimate bleeding risk or presence/abscense of coagulopathy in patients not on coumadin. Expected INR ranges for  nontherapeutic patients is 0.88 - 1.12.   Marland Kitchen Lovenox 04/05/2017 No   Final  Admission on 03/31/2017, Discharged on 04/02/2017  Component Date Value Ref Range Status  . WBC 03/31/2017 7.3  4.0 - 10.5 K/uL Final  . RBC 03/31/2017 4.02* 4.22 - 5.81 MIL/uL Final  . Hemoglobin 03/31/2017 11.9* 13.0 - 17.0 g/dL Final  . HCT 03/31/2017 36.3* 39.0 - 52.0 % Final  . MCV 03/31/2017 90.3  78.0 - 100.0 fL Final  . MCH 03/31/2017 29.6  26.0 -  34.0 pg Final   . MCHC 03/31/2017 32.8  30.0 - 36.0 g/dL Final  . RDW 03/31/2017 12.9  11.5 - 15.5 % Final  . Platelets 03/31/2017 123* 150 - 400 K/uL Final  . Neutrophils Relative % 03/31/2017 62  % Final  . Neutro Abs 03/31/2017 4.5  1.7 - 7.7 K/uL Final  . Lymphocytes Relative 03/31/2017 27  % Final  . Lymphs Abs 03/31/2017 2.0  0.7 - 4.0 K/uL Final  . Monocytes Relative 03/31/2017 10  % Final  . Monocytes Absolute 03/31/2017 0.7  0.1 - 1.0 K/uL Final  . Eosinophils Relative 03/31/2017 1  % Final  . Eosinophils Absolute 03/31/2017 0.0  0.0 - 0.7 K/uL Final  . Basophils Relative 03/31/2017 0  % Final  . Basophils Absolute 03/31/2017 0.0  0.0 - 0.1 K/uL Final  . Sodium 03/31/2017 136  135 - 145 mmol/L Final  . Potassium 03/31/2017 4.2  3.5 - 5.1 mmol/L Final  . Chloride 03/31/2017 102  101 - 111 mmol/L Final  . CO2 03/31/2017 24  22 - 32 mmol/L Final  . Glucose, Bld 03/31/2017 92  65 - 99 mg/dL Final  . BUN 03/31/2017 11  6 - 20 mg/dL Final  . Creatinine, Ser 03/31/2017 1.06  0.61 - 1.24 mg/dL Final  . Calcium 03/31/2017 9.2  8.9 - 10.3 mg/dL Final  . Total Protein 03/31/2017 7.3  6.5 - 8.1 g/dL Final  . Albumin 03/31/2017 4.1  3.5 - 5.0 g/dL Final  . AST 03/31/2017 23  15 - 41 U/L Final  . ALT 03/31/2017 16* 17 - 63 U/L Final  . Alkaline Phosphatase 03/31/2017 41  38 - 126 U/L Final  . Total Bilirubin 03/31/2017 1.1  0.3 - 1.2 mg/dL Final  . GFR calc non Af Amer 03/31/2017 >60  >60 mL/min Final  . GFR calc Af Amer 03/31/2017 >60  >60 mL/min Final   Comment: (NOTE) The eGFR has been calculated using the CKD EPI equation. This calculation has not been validated in all clinical situations. eGFR's persistently <60 mL/min signify possible Chronic Kidney Disease.   . Anion gap 03/31/2017 10  5 - 15 Final  . Coagulation Factor IX 03/31/2017 4* 60 - 177 % Final   Comment: (NOTE) Performed At: [facility] [place], [place] [id number] Lindon Romp MD NO:[id number]   .  HIV Screen 4th Generation wRfx 04/01/2017 Non Reactive  Non Reactive Final   Comment: (NOTE) Performed At: [facility] [place], [place] [id number] Lindon Romp MD PT:[id number]   . WBC 04/01/2017 6.8  4.0 - 10.5 K/uL Final  . RBC 04/01/2017 3.83* 4.22 - 5.81 MIL/uL Final  . Hemoglobin 04/01/2017 11.5* 13.0 - 17.0 g/dL Final  . HCT 04/01/2017 34.5* 39.0 - 52.0 % Final  . MCV 04/01/2017 90.1  78.0 - 100.0 fL Final  . MCH 04/01/2017 30.0  26.0 - 34.0 pg Final  . MCHC 04/01/2017 33.3  30.0 - 36.0 g/dL Final  . RDW 04/01/2017 13.3  11.5 - 15.5 % Final  . Platelets 04/01/2017 125* 150 - 400 K/uL Final  . Sodium 04/01/2017 136  135 - 145 mmol/L Final  . Potassium 04/01/2017 3.8  3.5 - 5.1 mmol/L Final  . Chloride 04/01/2017 104  101 - 111 mmol/L Final  . CO2 04/01/2017 22  22 - 32 mmol/L Final  . Glucose, Bld 04/01/2017 91  65 - 99 mg/dL Final  . BUN 04/01/2017 10  6 - 20 mg/dL  Final  . Creatinine, Ser 04/01/2017 0.89  0.61 - 1.24 mg/dL Final  . Calcium 04/01/2017 8.6* 8.9 - 10.3 mg/dL Final  . GFR calc non Af Amer 04/01/2017 >60  >60 mL/min Final  . GFR calc Af Amer 04/01/2017 >60  >60 mL/min Final   Comment: (NOTE) The eGFR has been calculated using the CKD EPI equation. This calculation has not been validated in all clinical situations. eGFR's persistently <60 mL/min signify possible Chronic Kidney Disease.   . Anion gap 04/01/2017 10  5 - 15 Final  . Prothrombin Time 04/01/2017 14.0  11.4 - 15.2 seconds Final  . INR 04/01/2017 1.09   Final  . aPTT 04/01/2017 35  24 - 36 seconds Final  . ABO/RH(D) 04/01/2017 A POS   Final  . Antibody Screen 04/01/2017 NEG   Final  . Sample Expiration 04/01/2017 04/04/2017   Final  . ABO/RH(D) 04/01/2017 A POS   Final  . Coagulation Factor VIII 04/01/2017 145  57 - 163 % Final  . Ristocetin Co-factor, Plasma 04/01/2017 193  50 - 200 % Final   Comment: (NOTE) Performed At: [facility] [place], [place] [id number] Lindon Romp MD TK:[id number]   . Von Willebrand Antigen, Plasma 04/01/2017 197  50 - 200 % Final   Comment: (NOTE) This test was developed and its performance characteristics determined by [facility]. It has not been cleared or approved by the Food and Drug Administration.   . Prothrombin Time 04/01/2017 13.6  11.4 - 15.2 seconds Final  . INR 04/01/2017 1.05   Final  . Coagulation Factor IX 04/01/2017 29* 60 - 177 % Final   Comment: (NOTE) Performed At: [facility] [place], [place] [id number] Lindon Romp MD SW:[id number]   . WBC 04/02/2017 6.1  4.0 - 10.5 K/uL Final  . RBC 04/02/2017 3.89* 4.22 - 5.81 MIL/uL Final  . Hemoglobin 04/02/2017 11.8* 13.0 - 17.0 g/dL Final  . HCT 04/02/2017 35.3* 39.0 - 52.0 % Final  . MCV 04/02/2017 90.7  78.0 - 100.0 fL Final  . MCH 04/02/2017 30.3  26.0 - 34.0 pg Final  . MCHC 04/02/2017 33.4  30.0 - 36.0 g/dL Final  . RDW 04/02/2017 13.0  11.5 - 15.5 % Final  . Platelets 04/02/2017 130* 150 - 400 K/uL Final  . Sodium 04/02/2017 136  135 - 145 mmol/L Final  . Potassium 04/02/2017 3.9  3.5 - 5.1 mmol/L Final  . Chloride 04/02/2017 102  101 - 111 mmol/L Final  . CO2 04/02/2017 27  22 - 32 mmol/L Final  . Glucose, Bld 04/02/2017 95  65 - 99 mg/dL Final  . BUN 04/02/2017 14  6 - 20 mg/dL Final  . Creatinine, Ser 04/02/2017 0.89  0.61 - 1.24 mg/dL Final  . Calcium 04/02/2017 8.7* 8.9 - 10.3 mg/dL Final  . GFR calc non Af Amer 04/02/2017 >60  >60 mL/min Final  . GFR calc Af Amer 04/02/2017 >60  >60 mL/min Final   Comment: (NOTE) The eGFR has been calculated using the CKD EPI equation. This calculation has not been validated in all clinical situations. eGFR's persistently <60 mL/min signify possible Chronic Kidney Disease.   . Anion gap 04/02/2017 7  5 - 15 Final  . Interpretation 04/01/2017 Note   Final   Comment: (NOTE) ------------------------------- COAGULATION: VON WILLEBRAND FACTOR  ASSESSMENT CURRENT RESULTS ASSESSMENT The VWF:Ag is normal. The VWF:RCo is normal. The FVIII is normal. VON WILLEBRAND FACTOR ASSESSMENT CURRENT RESULTS INTERPRETATION -  These results are not consistent with a diagnosis of VWD according to the current NHLBI guideline. VON WILLEBRAND FACTOR ASSESSMENT - Results may be falsely elevated and possibly falsely normal as VWF and FVIII may increase in samples drawn from patients (particularly children) who are visibly stressed at the time of phlebotomy, as acute phase reactants, or in response to certain drug therapies such as desmopressin. Repeat testing may be necessary before excluding a diagnosis of VWD especially if the clinical suspicion is high for an underlying bleeding disorder. The setting for phlebotomy should be as calm as possible and patients should be encouraged to sit quietly prior to the blood draw. VON WILLEBRAND FACTOR ASSESSMENT                           DEFINITIONS - VWD - von Willebrand disease; VWF - von Willebrand factor; VWF:Ag - VWF antigen; VWF:RCo - VWF ristocetin cofactor activity; FVIII - factor VIII activity. - MEDICAL DIRECTOR: For questions regarding panel interpretation, please contact Jake Bathe, M.D. at [facility] at [phone]. ------------------------------- DISCLAIMER These assessments and interpretations are provided as a convenience in support of the physician-patient relationship and are not intended to replace the physician's clinical judgment. They are derived from national guidelines in addition to other evidence and expert opinion. The clinician should consider this information within the context of clinical opinion and the individual patient. SEE GUIDANCE FOR VON WILLEBRAND FACTOR ASSESSMENT: (1) The National Heart, Lung and Blood Institute. The Diagnosis, Evaluation and Management of von Willebrand Disease. Janeal Holmes, MD: [place]                          n [phone]. 2007. Available at vSpecials.com.pt. (2) Daryl Eastern et al. Carmin Muskrat J Hematol. 2009; 84(6):366-370. (3) Belva. 2004;10(3):199-217. (4) Pasi KJ et al. Haemophilia. 2004; 10(3):218-231. Performed At: [facility] [place], [place] [id number] Thomasene Ripple MD ER:[id number]        Ardath Sax, MD

## 2017-04-08 LAB — PTT FACTOR INHIBITOR (MIXING STUDY)

## 2018-08-30 ENCOUNTER — Telehealth: Payer: Self-pay | Admitting: Primary Care

## 2018-08-30 DIAGNOSIS — Z20828 Contact with and (suspected) exposure to other viral communicable diseases: Secondary | ICD-10-CM

## 2018-08-30 MED ORDER — OSELTAMIVIR PHOSPHATE 75 MG PO CAPS: 75.0000 mg | ORAL_CAPSULE | Freq: Two times a day (BID) | ORAL | 0 refills | Status: AC

## 2018-08-30 NOTE — Telephone Encounter (Signed)
Patient's wife evaluated today and diagnosed with influenza A. Patient now has cough x 1 day, history of influenza with ARDS and pneumonia in 2018. Rx for Tamiflu course sent to pharmacy.

## 2021-11-16 ENCOUNTER — Emergency Department
Admission: EM | Admit: 2021-11-16 | Discharge: 2021-11-16 | Disposition: A | Discharge status: Home / Self Care | Payer: 59 | Attending: Emergency Medicine | Admitting: Emergency Medicine

## 2021-11-16 ENCOUNTER — Encounter: Payer: Self-pay | Admitting: Emergency Medicine

## 2021-11-16 ENCOUNTER — Emergency Department: Payer: 59

## 2021-11-16 ENCOUNTER — Other Ambulatory Visit: Payer: Self-pay

## 2021-11-16 DIAGNOSIS — N23 Unspecified renal colic: Secondary | ICD-10-CM | POA: Diagnosis not present

## 2021-11-16 DIAGNOSIS — R109 Unspecified abdominal pain: Secondary | ICD-10-CM

## 2021-11-16 LAB — URINALYSIS, ROUTINE W REFLEX MICROSCOPIC
Bacteria, UA: NONE SEEN
Bilirubin Urine: NEGATIVE
Glucose, UA: NEGATIVE mg/dL
Ketones, ur: 20 mg/dL — AB
Leukocytes,Ua: NEGATIVE
Nitrite: NEGATIVE
Protein, ur: 100 mg/dL — AB
RBC / HPF: >50 RBC/hpf — ABNORMAL HIGH (ref 0–5)
Specific Gravity, Urine: 1.019 (ref 1.005–1.030)
pH: 5 (ref 5.0–8.0)

## 2021-11-16 LAB — BASIC METABOLIC PANEL
Anion gap: 12 (ref 5–15)
BUN: 16 mg/dL (ref 6–20)
CO2: 21 mmol/L — ABNORMAL LOW (ref 22–32)
Calcium: 9.4 mg/dL (ref 8.9–10.3)
Chloride: 103 mmol/L (ref 98–111)
Creatinine, Ser: 0.97 mg/dL (ref 0.61–1.24)
GFR, Estimated: >60 mL/min (ref >60)
Glucose, Bld: 113 mg/dL — ABNORMAL HIGH (ref 70–99)
Potassium: 3.6 mmol/L (ref 3.5–5.1)
Sodium: 136 mmol/L (ref 135–145)

## 2021-11-16 LAB — CBC WITH DIFFERENTIAL/PLATELET
Abs Immature Granulocytes: 0.05 10*3/uL (ref 0.00–0.07)
Basophils Absolute: 0 10*3/uL (ref 0.0–0.1)
Basophils Relative: 0 %
Eosinophils Absolute: 0.1 10*3/uL (ref 0.0–0.5)
Eosinophils Relative: 1 %
HCT: 50.8 % (ref 39.0–52.0)
Hemoglobin: 16.8 g/dL (ref 13.0–17.0)
Immature Granulocytes: 1 %
Lymphocytes Relative: 30 %
Lymphs Abs: 2.4 10*3/uL (ref 0.7–4.0)
MCH: 30 pg (ref 26.0–34.0)
MCHC: 33.1 g/dL (ref 30.0–36.0)
MCV: 90.7 fL (ref 80.0–100.0)
Monocytes Absolute: 0.7 10*3/uL (ref 0.1–1.0)
Monocytes Relative: 9 %
Neutro Abs: 4.6 10*3/uL (ref 1.7–7.7)
Neutrophils Relative %: 59 %
Platelets: 120 10*3/uL — ABNORMAL LOW (ref 150–400)
RBC: 5.6 MIL/uL (ref 4.22–5.81)
RDW: 12.6 % (ref 11.5–15.5)
WBC: 7.8 10*3/uL (ref 4.0–10.5)
nRBC: 0 % (ref 0.0–0.2)

## 2021-11-16 MED ORDER — HYDROMORPHONE HCL 1 MG/ML IJ SOLN
0.5000 mg | Freq: Once | INTRAMUSCULAR | Status: AC
  Administered 2021-11-16: 0.5 mg via INTRAVENOUS
  Filled 2021-11-16: qty 0.5

## 2021-11-16 MED ORDER — HYDROMORPHONE HCL 1 MG/ML IJ SOLN
1.0000 mg | Freq: Once | INTRAMUSCULAR | Status: AC
  Administered 2021-11-16: 1 mg via INTRAVENOUS
  Filled 2021-11-16: qty 1

## 2021-11-16 MED ORDER — SODIUM CHLORIDE 0.9 % IV BOLUS
1000.0000 mL | Freq: Once | INTRAVENOUS | Status: AC
  Administered 2021-11-16: 1000 mL via INTRAVENOUS

## 2021-11-16 MED ORDER — ONDANSETRON HCL 4 MG/2ML IJ SOLN
4.0000 mg | Freq: Once | INTRAMUSCULAR | Status: AC
  Administered 2021-11-16: 4 mg via INTRAVENOUS
  Filled 2021-11-16: qty 2

## 2021-11-16 MED ORDER — OXYCODONE-ACETAMINOPHEN 5-325 MG PO TABS
1.0000 | ORAL_TABLET | Freq: Once | ORAL | Status: AC
  Administered 2021-11-16: 1 via ORAL
  Filled 2021-11-16: qty 1

## 2021-11-16 MED ORDER — OXYCODONE-ACETAMINOPHEN 5-325 MG PO TABS: 1.0000 | ORAL_TABLET | ORAL | 0 refills | Status: DC | PRN

## 2021-11-16 MED ORDER — TAMSULOSIN HCL 0.4 MG PO CAPS
0.4000 mg | ORAL_CAPSULE | Freq: Once | ORAL | Status: AC
  Administered 2021-11-16: 0.4 mg via ORAL
  Filled 2021-11-16: qty 1

## 2021-11-16 MED ORDER — ONDANSETRON 4 MG PO TBDP: 4.0000 mg | ORAL_TABLET | Freq: Three times a day (TID) | ORAL | 0 refills | Status: DC | PRN

## 2021-11-16 MED ORDER — TAMSULOSIN HCL 0.4 MG PO CAPS: 0.4000 mg | ORAL_CAPSULE | Freq: Every day | ORAL | 0 refills | Status: DC

## 2021-11-16 NOTE — ED Notes (Signed)
Pt noted to desat to 89% on RA after receiving IV dilaudid. Pt states recently dx with sleep apnea and it waiting on his machine at home. Pt awakens with mild verbal stimuli upon staff entering the room.

## 2021-11-16 NOTE — ED Notes (Signed)
Pt c/o sudden onset R lower back pain that started earlier tonight. Pt states initially thought was constipated due to being on Keto diet, took 2 laxatives without relief. Pt states severe pain with N/V. Pt appears uncomfortable on assessment. Pt denies burning with urination/difficulty urinating at this time.

## 2021-11-16 NOTE — ED Notes (Signed)
Pt had a large amt of emesis in lobby, pt also reports taking 2 dulcolax around 330am.   Pt unable to sit still. Provided specimen cup for urine sample.

## 2021-11-16 NOTE — ED Notes (Signed)
This RN to bedside due to patient calling out, pt states pain somewhat improved from 10 to 7, pt states pain now radiates down to his testicles. EDP Dolores Frame made aware.

## 2021-11-16 NOTE — Discharge Instructions (Signed)
1. Take pain & nausea medicines as needed (Percocet/Zofran #30). Make sure to take a stool softener while taking narcotic pain medicines. 2. Take Flomax 0.4mg daily x 14 days. 3. Drink plenty of bottled or filtered water daily. 4. Return to the ER for worsening symptoms, persistent vomiting, fever, difficulty breathing or other concerns.  

## 2021-11-16 NOTE — ED Notes (Signed)
RN to bedside to introduce self to pt. Pt plan to be DC around 745. Oxygen sats dropping into lower 80s when he falls asleep. Pt does have recent diagnosis of sleep apnea and is awaiting a machine. Wife at bedside.

## 2021-11-16 NOTE — ED Triage Notes (Addendum)
Pt arrived via POV with reports of R flank pain that started tonight, pt c/o nausea, in obvious discomfort at registration. No prior hx of kidney stones.

## 2021-11-16 NOTE — ED Provider Notes (Signed)
[facility] Provider Note    Event Date/Time   First MD Initiated Contact with Patient 11/16/21 [phone]     (approximate)   History   Flank Pain   HPI  Chris Winters is a 41 y.o. male  who presents to the ED from home with a chief complaint of right flank pain. Patient was awakened from sleep around 0330 with pain associated with nausea. Vomited in triage. No previous history of kidney stones. Denies associated fever, chills, chest pain, shortness of breath, abdominal pain, hematuria, testicular pain or swelling. Denies recent travel or trauma.     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 Authorizing Provider  ondansetron (ZOFRAN-ODT) 4 MG disintegrating tablet Take 1 tablet (4 mg total) by mouth every 8 (eight) hours as needed for nausea or vomiting. 11/16/21  Yes Irean Hong, MD  oxyCODONE-acetaminophen (PERCOCET/ROXICET) 5-325 MG tablet Take 1 tablet by mouth every 4 (four) hours as needed for severe pain. 11/16/21  Yes Irean Hong, MD  tamsulosin (FLOMAX) 0.4 MG CAPS capsule Take 1 capsule (0.4 mg total) by mouth daily. 11/16/21  Yes Irean Hong, MD  Ascorbic Acid (VITAMIN C PO) Take 1 tablet by mouth daily.    [provider]  BIOTIN PO Take 1 tablet by mouth daily.    [provider]  cetirizine (ZYRTEC) 10 MG tablet Take 10 mg by mouth daily.     [provider]  Melatonin 5 MG TABS Take 5 mg by mouth at bedtime.    [provider]  Multiple Vitamins-Minerals (CENTRUM MEN) TABS Take 1 tablet by mouth daily.    [provider]     Allergies  Penicillins, Aspirin, Morphine and related, and Prednisone   Family History   Family History  Problem Relation Age of Onset   COPD Mother    Hemophilia Mother    Diabetes Father    Heart disease Father    Hemophilia Brother    Lung cancer Maternal Grandmother    Lung cancer Maternal Grandfather      Physical Exam  Triage Vital Signs: ED Triage Vitals  Enc Vitals Group     BP 11/16/21 0451 (!) 172/107     Pulse Rate 11/16/21 0451 82     Resp 11/16/21 0451 (!) 22     Temp 11/16/21 0451 97.8 F (36.6 C)     Temp Source 11/16/21 0451 Oral     SpO2 11/16/21 0451 99 %     Weight 11/16/21 0444 (!) 340 lb (154.2 kg)     Height 11/16/21 0444 6' (1.829 m)     Head Circumference --      Peak Flow --      Pain Score 11/16/21 0444 10     Pain  Loc --      Pain Edu? --      Excl. in GC? --     Updated Vital Signs: BP (!) 154/96 (BP Location: Left Arm)   Pulse 74   Temp 97.8 F (36.6 C) (Oral)   Resp (!) 21   Ht 6' (1.829 m)   Wt (!) 154.2 kg   SpO2 98%   BMI 46.11 kg/m    General: Awake, moderate distress.  CV:  RRR. Good peripheral perfusion.  Resp:  Normal effort. CTAB. Abd:  Nontender. Mild right CVAT. No distention.  Other:  No vesicles.   ED Results / Procedures / Treatments  Labs (all labs ordered are listed, but only abnormal results are displayed) Labs Reviewed  CBC WITH DIFFERENTIAL/PLATELET - Abnormal; Notable for the following components:      Result Value   Platelets 120 (*)    All other components within normal limits  BASIC METABOLIC PANEL - Abnormal; Notable for the following components:   CO2 21 (*)    Glucose, Bld 113 (*)    All other components within normal limits  URINALYSIS, ROUTINE W REFLEX MICROSCOPIC -  Abnormal; Notable for the following components:   Color, Urine YELLOW (*)    APPearance CLEAR (*)    Hgb urine dipstick MODERATE (*)    Ketones, ur 20 (*)    Protein, ur 100 (*)    RBC / HPF >50 (*)    All other components within normal limits     EKG  None   RADIOLOGY I have independently visualized and interpreted patient's CT scan as well as noted the radiology interpretation:  CT Renal Colic: 3 mm right UVJ stone with moderate hydroureteronephrosis  Official radiology report(s): CT Renal Stone Study  Result Date: 11/16/2021 CLINICAL DATA:  41 year old male with history of right-sided flank pain. Nausea. EXAM: CT ABDOMEN AND PELVIS WITHOUT CONTRAST TECHNIQUE: Multidetector CT imaging of the abdomen and pelvis was performed following the standard protocol without IV contrast. RADIATION DOSE REDUCTION: This exam was performed according to the departmental dose-optimization program which includes automated exposure control, adjustment of the mA and/or kV according to patient size and/or use of iterative reconstruction technique. COMPARISON:  No priors. FINDINGS: Lower chest: Unremarkable. Hepatobiliary: Subcentimeter low-attenuation lesion in the central aspect of segment 8 of the liver (axial image 7 of series 2), incompletely characterized on today's non-contrast CT examination, but statistically likely a tiny cyst. No other suspicious cystic or solid hepatic lesions are confidently identified on today's noncontrast CT examination. Unenhanced appearance of the gallbladder is normal. Pancreas: No definite pancreatic mass or peripancreatic fluid collections or inflammatory changes are noted on today's noncontrast CT examination. Spleen: Unremarkable. Adrenals/Urinary Tract: At the right ureterovesicular junction there is a 3 mm calculus. This is associated with mild-to-moderate proximal right hydroureteronephrosis. No additional calculi are identified within the collecting system of either  kidney, elsewhere along the course of either ureter, or within the lumen of the urinary bladder. No left hydroureteronephrosis is noted. Unenhanced appearance of the kidneys, bilateral adrenal glands and urinary bladder is otherwise unremarkable. Stomach/Bowel: Unenhanced appearance of the stomach is normal. No pathologic dilatation of small bowel or colon. A few scattered colonic diverticulae are noted, without surrounding inflammatory changes to indicate an associated acute diverticulitis at this time. Normal appendix. Vascular/Lymphatic: Aortic atherosclerosis. No lymphadenopathy noted in the abdomen or pelvis. Reproductive: Prostate gland and seminal vesicles are unremarkable in appearance. Other: No significant volume of ascites.  No pneumoperitoneum. Musculoskeletal: There  are no aggressive appearing lytic or blastic lesions noted in the visualized portions of the skeleton. IMPRESSION: 1. 3 mm obstructive calculus at the right ureterovesicular junction with mild-to-moderate proximal right hydroureteronephrosis. 2. Aortic atherosclerosis. 3. Mild colonic diverticulosis without evidence of acute diverticulitis at this time. Electronically Signed   By: Trudie Reedaniel  Entrikin M.D.   On: 11/16/2021 05:56     PROCEDURES:  Critical Care performed: Yes, see critical care procedure note(s)  CRITICAL CARE Performed by: Irean HongSUNG,Kwasi Joung J   Total critical care time: 30 minutes  Critical care time was exclusive of separately billable procedures and treating other patients.  Critical care was necessary to treat or prevent imminent or life-threatening deterioration.  Critical care was time spent personally by me on the following activities: development of treatment plan with patient and/or surrogate as well as nursing, discussions with consultants, evaluation of patient's response to treatment, examination of patient, obtaining history from patient or surrogate, ordering and performing treatments and interventions,  ordering and review of laboratory studies, ordering and review of radiographic studies, pulse oximetry and re-evaluation of patient's condition.   Marland Kitchen.1-3 Lead EKG Interpretation Performed by: Irean HongSung, Natori Gudino J, MD Authorized by: Irean HongSung, Caly Pellum J, MD     Interpretation: normal     ECG rate:  75   ECG rate assessment: normal     Rhythm: sinus rhythm     Ectopy: none     Conduction: normal   Comments:     Patient placed on cardiac monitor to evaluate for arrhythmias   MEDICATIONS ORDERED IN ED: Medications  sodium chloride 0.9 % bolus 1,000 mL (0 mLs Intravenous Stopped 11/16/21 0633)  HYDROmorphone (DILAUDID) injection 1 mg (1 mg Intravenous Given 11/16/21 0519)  ondansetron (ZOFRAN) injection 4 mg (4 mg Intravenous Given 11/16/21 0519)  oxyCODONE-acetaminophen (PERCOCET/ROXICET) 5-325 MG per tablet 1 tablet (1 tablet Oral Given 11/16/21 0634)  tamsulosin (FLOMAX) capsule 0.4 mg (0.4 mg Oral Given 11/16/21 0634)  HYDROmorphone (DILAUDID) injection 0.5 mg (0.5 mg Intravenous Given 11/16/21 [id number])     IMPRESSION / MDM / ASSESSMENT AND PLAN / ED COURSE  I reviewed the triage vital signs and the nursing notes.                             41 year old male presenting with right flank pain.  I have identified a potentially life-threatening condition in this patient. Differential diagnosis includes, but is not limited to, acute appendicitis, renal colic, testicular torsion, urinary tract infection/pyelonephritis, prostatitis,  epididymitis, diverticulitis, small bowel obstruction or ileus, colitis, abdominal aortic aneurysm, gastroenteritis, hernia, etc.   I have personally reviewed patient's records and see that he had a recent PCP visit on 09/26/2021 for prediabetes, obesity, factor IX deficiency and snoring.  We will obtain lab work, UA, CT renal colic stand.  Initiate IV fluid resuscitation, IV Dilaudid for pain paired with IV Zofran for nausea.  Will reassess.  Clinical Course as of 11/16/21 [id number]  Wynelle LinkSun  Nov 16, 2021  0722 Patient was feeling better.  Updated patient and spouse of all laboratory and imaging results.  He was giving an additional 0.5 mg IV diet malleted and now sleeping.  Has a history of obstructive sleep apnea.  Placed on 2 L O2 nasal cannula for room air saturations in the high 80%'s.  Will be monitored in the ED until patient is more awake and able to be discharged home. [JS]    Clinical Course User Index [JS] Dolores FrameSung,  Glencoe Sink, MD      FINAL CLINICAL IMPRESSION(S) / ED DIAGNOSES   Final diagnoses:  Right flank pain  Ureteral colic     Rx / DC Orders   ED Discharge Orders          Ordered    oxyCODONE-acetaminophen (PERCOCET/ROXICET) 5-325 MG tablet  Every 4 hours PRN        11/16/21 0606    ondansetron (ZOFRAN-ODT) 4 MG disintegrating tablet  Every 8 hours PRN        11/16/21 0606    tamsulosin (FLOMAX) 0.4 MG CAPS capsule  Daily        11/16/21 0606             Note:  This document was prepared using Dragon voice recognition software and may include unintentional dictation errors.   Irean Hong, MD 11/16/21 [phone]

## 2021-11-28 ENCOUNTER — Encounter: Payer: Self-pay | Admitting: Urology

## 2021-11-28 ENCOUNTER — Ambulatory Visit
Admission: RE | Admit: 2021-11-28 | Discharge: 2021-11-28 | Disposition: A | Discharge status: Home / Self Care | Payer: 59 | Source: Ambulatory Visit | Attending: Urology | Admitting: Urology

## 2021-11-28 ENCOUNTER — Ambulatory Visit
Admission: RE | Admit: 2021-11-28 | Discharge: 2021-11-28 | Disposition: A | Discharge status: Home / Self Care | Payer: 59 | Attending: Urology | Admitting: Urology

## 2021-11-28 ENCOUNTER — Ambulatory Visit: Payer: 59 | Admitting: Urology

## 2021-11-28 VITALS — BP 120/74 | HR 86 | Ht 72.0 in | Wt 340.0 lb

## 2021-11-28 DIAGNOSIS — N201 Calculus of ureter: Secondary | ICD-10-CM | POA: Diagnosis present

## 2021-11-28 DIAGNOSIS — N132 Hydronephrosis with renal and ureteral calculous obstruction: Secondary | ICD-10-CM | POA: Diagnosis not present

## 2021-11-28 LAB — URINALYSIS, COMPLETE
Bilirubin, UA: NEGATIVE
Glucose, UA: NEGATIVE
Ketones, UA: NEGATIVE
Leukocytes,UA: NEGATIVE
Nitrite, UA: NEGATIVE
Specific Gravity, UA: 1.02 (ref 1.005–1.030)
Urobilinogen, Ur: 0.2 mg/dL (ref 0.2–1.0)
pH, UA: 7 (ref 5.0–7.5)

## 2021-11-28 LAB — MICROSCOPIC EXAMINATION

## 2021-11-28 NOTE — Progress Notes (Signed)
 11/28/2021 1:18 PM   Chris Winters 06/30/1980 [id number]  Referring provider: No referring provider defined for this encounter.  Chief Complaint  Patient presents with   Nephrolithiasis    HPI: Chris Winters is a 41 y.o. male who presents for follow-up of recent ED visit for renal colic.  Presented to [facility] ED 11/16/2021 with cute onset right flank pain associated with nausea, vomiting No fever, chills or voiding symptoms Urinalysis with >50 RBCs Stone protocol CT showed a 3 mm right UVJ calculus with moderate hydronephrosis/hydroureter Received parenteral analgesics, antiemetics with good pain control and was discharged on oxycodone, Zofran and tamsulosin Since ED visit he took pain medication for 2 days and has not required any since that time Notes occasional mild twinges of pain No bothersome LUTS No prior history stone disease Remains on tamsulosin   PMH: Past Medical History:  Diagnosis Date   HCV (hepatitis C virus)    Hemophilia B (HCC)    Left leg swelling 03/2017   OSA (obstructive sleep apnea)     Surgical History: Past Surgical History:  Procedure Laterality Date   TONSILLECTOMY      Home Medications:  Allergies as of 11/28/2021       Reactions   Penicillins Anaphylaxis   Parents told him that he would go into anaphylactic shock if he takes it Has patient had a PCN reaction causing immediate rash, facial/tongue/throat swelling, SOB or lightheadedness with hypotension: Yes Has patient had a PCN reaction causing severe rash involving mucus membranes or skin necrosis: No Has patient had a PCN reaction that required hospitalization: No Has patient had a PCN reaction occurring within the last 10 years: No If all of the above answers are "NO", then may proceed with Cephal   Aspirin Other (See Comments)   Hemophiliac   Morphine And Related Other (See Comments)   Patient has a very high tolerance to Morphine   Prednisone Nausea And Vomiting    Severe vomiting          Medication List        Accurate as of November 28, 2021  1:18 PM. If you have any questions, ask your nurse or doctor.          amphetamine-dextroamphetamine 20 MG 24 hr capsule Commonly known as: ADDERALL XR Take 20 mg by mouth every morning.   BIOTIN PO Take 1 tablet by mouth daily.   Centrum Men Tabs Take 1 tablet by mouth daily.   cetirizine 10 MG tablet Commonly known as: ZYRTEC Take 10 mg by mouth daily.   cetirizine 10 MG tablet Commonly known as: ZYRTEC 1 tablet   melatonin 5 MG Tabs Take 5 mg by mouth at bedtime.   ondansetron 4 MG disintegrating tablet Commonly known as: ZOFRAN-ODT Take 1 tablet (4 mg total) by mouth every 8 (eight) hours as needed for nausea or vomiting.   oxyCODONE-acetaminophen 5-325 MG tablet Commonly known as: PERCOCET/ROXICET Take 1 tablet by mouth every 4 (four) hours as needed for severe pain.   tamsulosin 0.4 MG Caps capsule Commonly known as: Flomax Take 1 capsule (0.4 mg total) by mouth daily.   VITAMIN C PO Take 1 tablet by mouth daily.        Allergies:  Allergies  Allergen Reactions   Penicillins Anaphylaxis    Parents told him that he would go into anaphylactic shock if he takes it Has patient had a PCN reaction causing immediate rash, facial/tongue/throat swelling, SOB or lightheadedness with hypotension:   Yes Has patient had a PCN reaction causing severe rash involving mucus membranes or skin necrosis: No Has patient had a PCN reaction that required hospitalization: No Has patient had a PCN reaction occurring within the last 10 years: No If all of the above answers are "NO", then may proceed with Cephal   Aspirin Other (See Comments)    Hemophiliac     Morphine And Related Other (See Comments)    Patient has a very high tolerance to Morphine   Prednisone Nausea And Vomiting    Severe vomiting      Family History: Family History  Problem Relation Age of Onset   COPD Mother     Hemophilia Mother    Diabetes Father    Heart disease Father    Hemophilia Brother    Lung cancer Maternal Grandmother    Lung cancer Maternal Grandfather     Social History:  reports that he has never smoked. He has never used smokeless tobacco. He reports that he does not drink alcohol and does not use drugs.   Physical Exam: BP 120/74   Pulse 86   Ht 6' (1.829 m)   Wt (!) 340 lb (154.2 kg)   BMI 46.11 kg/m   Constitutional:  Alert and oriented, No acute distress. HEENT:  AT Respiratory: Normal respiratory effort, no increased work of breathing. Psychiatric: Normal mood and affect.  Laboratory Data:  Urinalysis Microscopy 3-10 RBC  Pertinent Imaging: CT images were personally reviewed and interpreted  CT Renal Stone Study  Narrative CLINICAL DATA:  41-year-old male with history of right-sided flank pain. Nausea.  EXAM: CT ABDOMEN AND PELVIS WITHOUT CONTRAST  TECHNIQUE: Multidetector CT imaging of the abdomen and pelvis was performed following the standard protocol without IV contrast.  RADIATION DOSE REDUCTION: This exam was performed according to the departmental dose-optimization program which includes automated exposure control, adjustment of the mA and/or kV according to patient size and/or use of iterative reconstruction technique.  COMPARISON:  No priors.  FINDINGS: Lower chest: Unremarkable.  Hepatobiliary: Subcentimeter low-attenuation lesion in the central aspect of segment 8 of the liver (axial image 7 of series 2), incompletely characterized on today's non-contrast CT examination, but statistically likely a tiny cyst. No other suspicious cystic or solid hepatic lesions are confidently identified on today's noncontrast CT examination. Unenhanced appearance of the gallbladder is normal.  Pancreas: No definite pancreatic mass or peripancreatic fluid collections or inflammatory changes are noted on today's noncontrast CT  examination.  Spleen: Unremarkable.  Adrenals/Urinary Tract: At the right ureterovesicular junction there is a 3 mm calculus. This is associated with mild-to-moderate proximal right hydroureteronephrosis. No additional calculi are identified within the collecting system of either kidney, elsewhere along the course of either ureter, or within the lumen of the urinary bladder. No left hydroureteronephrosis is noted. Unenhanced appearance of the kidneys, bilateral adrenal glands and urinary bladder is otherwise unremarkable.  Stomach/Bowel: Unenhanced appearance of the stomach is normal. No pathologic dilatation of small bowel or colon. A few scattered colonic diverticulae are noted, without surrounding inflammatory changes to indicate an associated acute diverticulitis at this time. Normal appendix.  Vascular/Lymphatic: Aortic atherosclerosis. No lymphadenopathy noted in the abdomen or pelvis.  Reproductive: Prostate gland and seminal vesicles are unremarkable in appearance.  Other: No significant volume of ascites.  No pneumoperitoneum.  Musculoskeletal: There are no aggressive appearing lytic or blastic lesions noted in the visualized portions of the skeleton.  IMPRESSION: 1. 3 mm obstructive calculus at the right ureterovesicular junction with   mild-to-moderate proximal right hydroureteronephrosis. 2. Aortic atherosclerosis. 3. Mild colonic diverticulosis without evidence of acute diverticulitis at this time.   Electronically Signed By: Daniel  Entrikin M.D. On: 11/16/2021 05:56   Assessment & Plan:    1.  Right ureteral calculus Minimally symptomatic It is possible he may have passed the stone without knowing although UA today still with microhematuria KUB was obtained and if the calculus is visualized options were discussed of continued MET for 1-2 weeks, shockwave lithotripsy and ureteroscopic removal.  The pros and cons of each option were discussed If the  calculus is not visualized will obtain a follow-up renal ultrasound to document persistence or resolution of his hydronephrosis He will be notified with the KUB results    Scott C Stoioff, MD  [facility] [address], [address] [place], [place] [address] [phone]  

## 2021-11-30 ENCOUNTER — Telehealth: Payer: Self-pay | Admitting: Urology

## 2021-11-30 DIAGNOSIS — N132 Hydronephrosis with renal and ureteral calculous obstruction: Secondary | ICD-10-CM

## 2021-11-30 NOTE — Telephone Encounter (Signed)
Stone not seen on KUB. RUS order entered to see is stone still present. Will call with results

## 2021-12-01 ENCOUNTER — Encounter: Payer: Self-pay | Admitting: *Deleted

## 2022-01-05 ENCOUNTER — Telehealth: Payer: Self-pay | Admitting: Urology

## 2022-01-05 NOTE — Telephone Encounter (Signed)
Renal ultrasound was ordered last month to see if hydronephrosis has resolved however not yet scheduled.  Please contact patient regarding scheduling of this ultrasound.  If obstruction is still present he could lose renal function.

## 2023-03-27 IMAGING — CR DG ABDOMEN 1V
4 series · 4 of 4 positions shown · non-contrast
Comparison: CT 11/16/2021

CLINICAL DATA: Right-sided kidney stone follow-up.

EXAM:
ABDOMEN - 1 VIEW

[abdomen kub (1 of 4)]
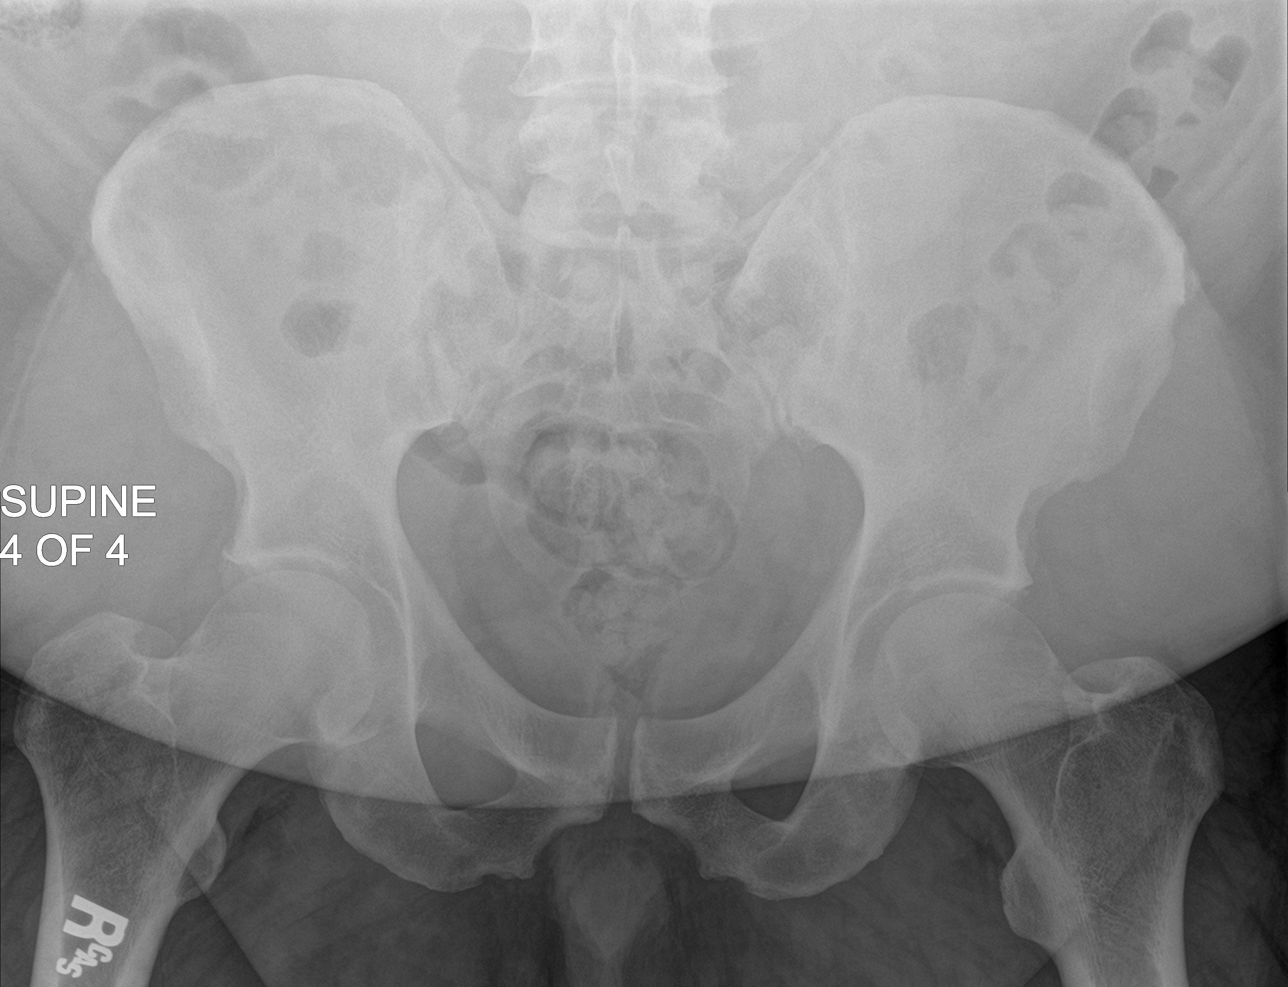

[abdomen kub (2 of 4)]
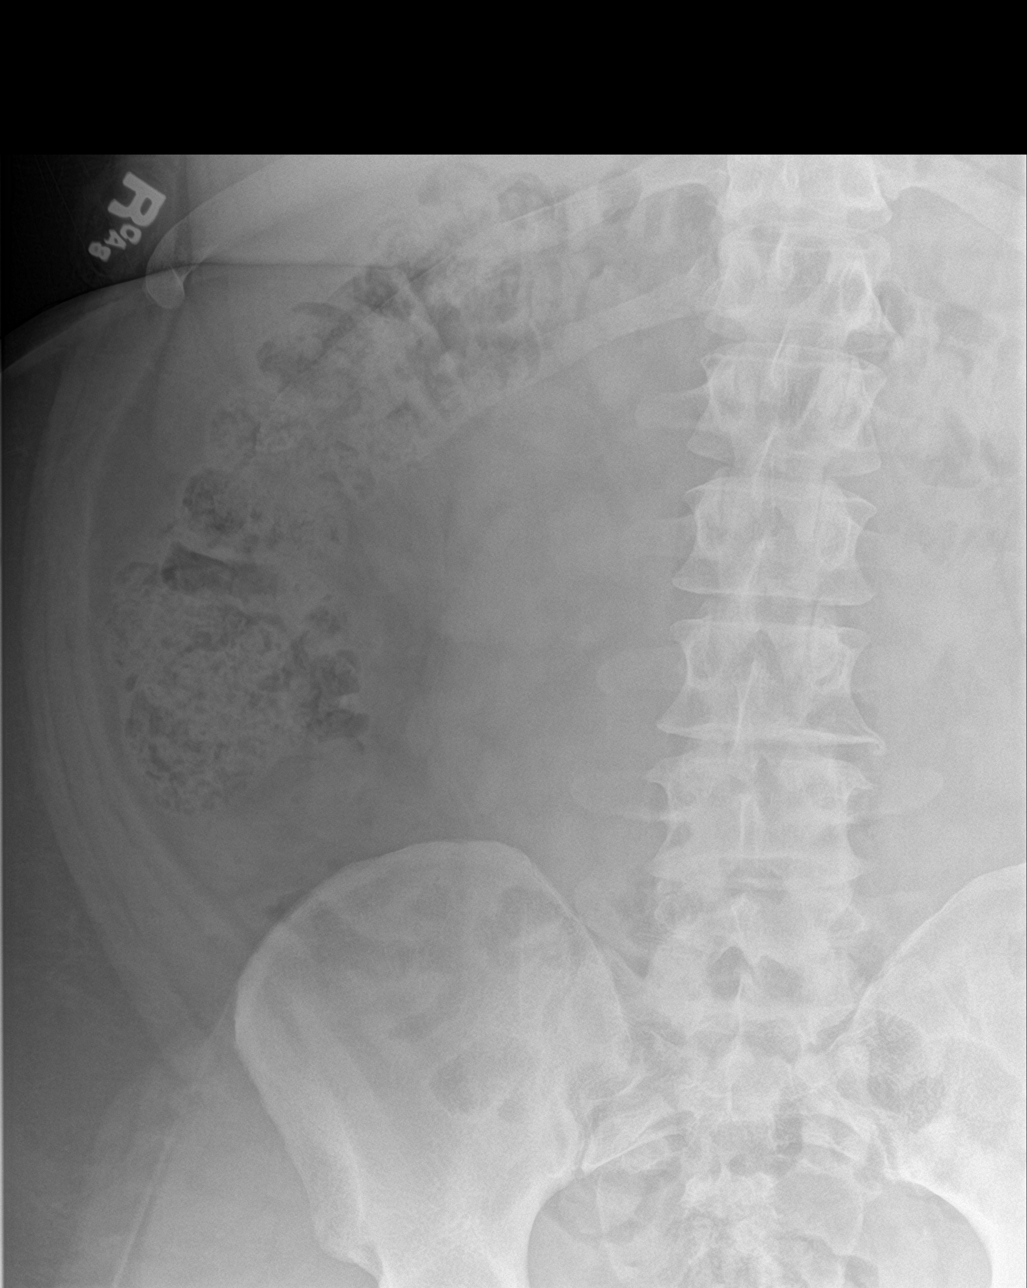

[abdomen kub (3 of 4)]
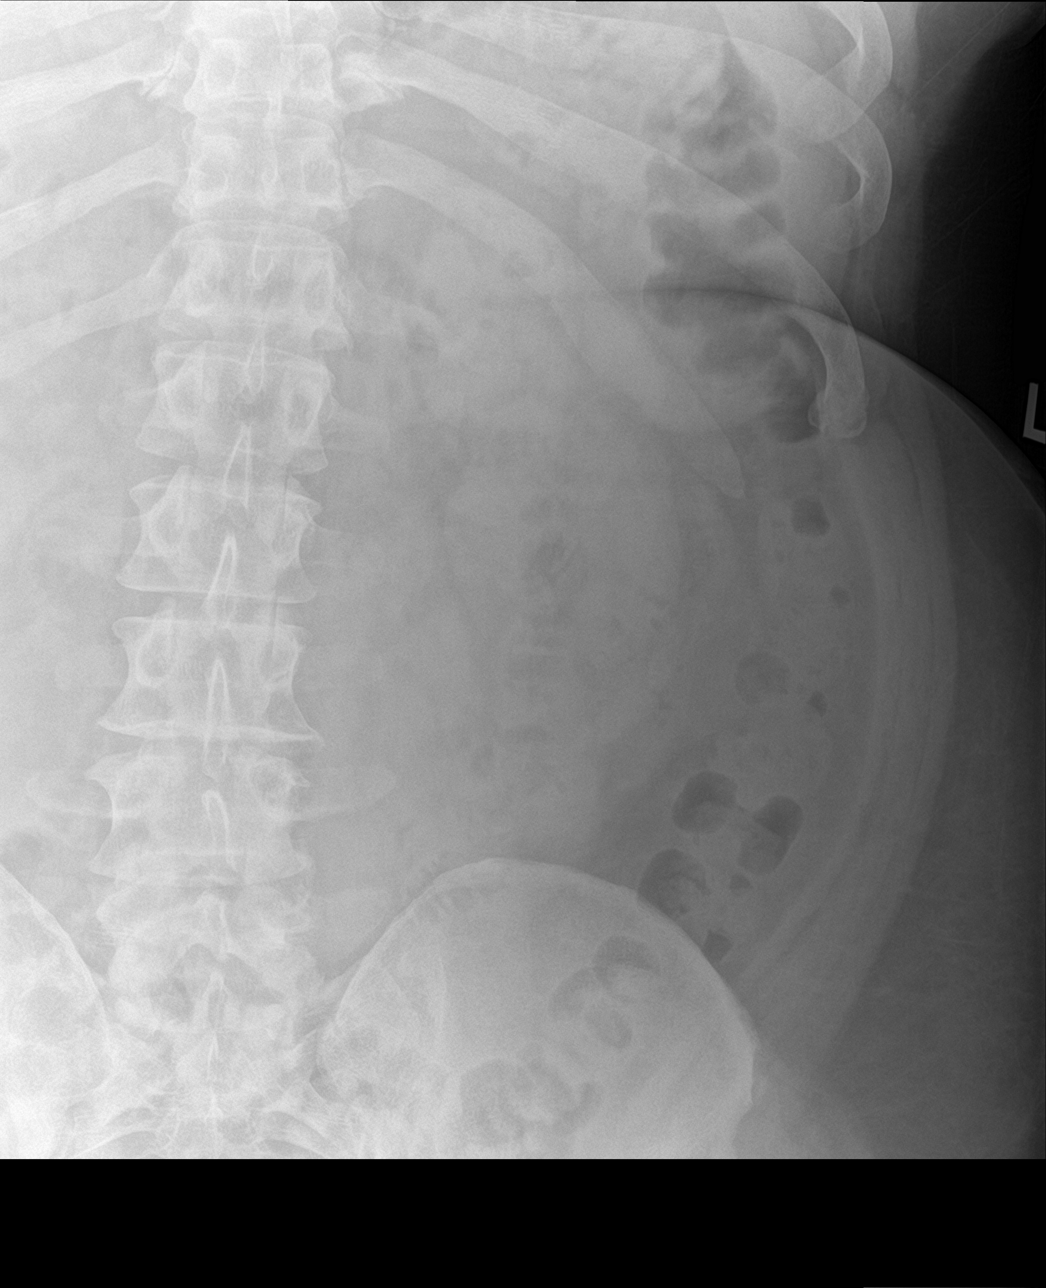

[abdomen kub (4 of 4)]
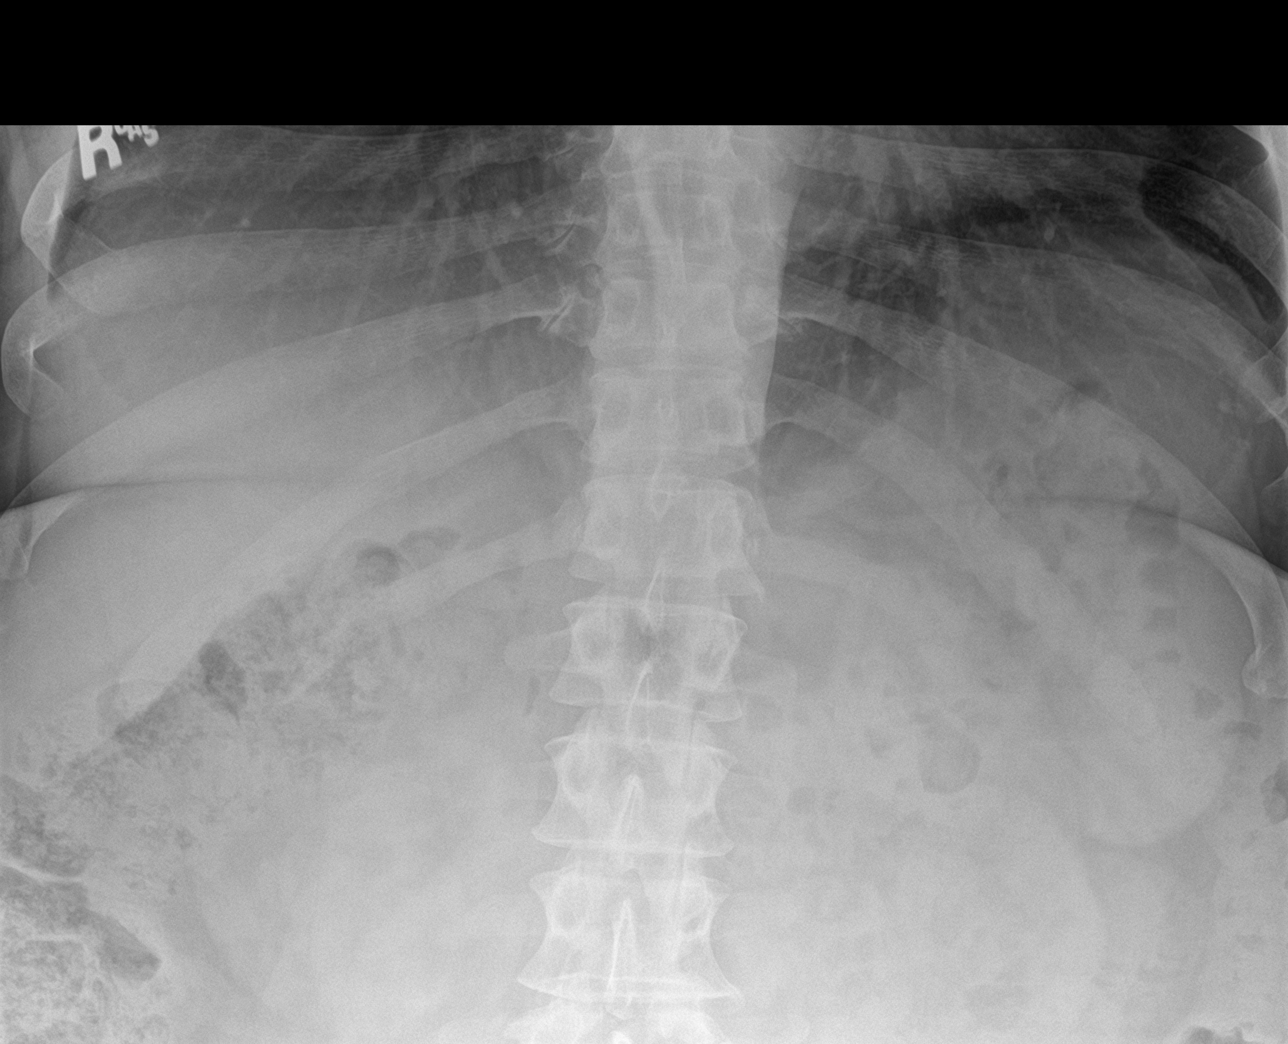

[4 of 4 positions shown; findings below may reference images not displayed]

FINDINGS: Previous right ureteral vesicular junction calculus on CT is not
definitively seen on the current exam. No urolithiasis. Normal bowel
gas pattern.
IMPRESSION: Previous right ureteral vesicular junction calculus on CT is not
definitively seen on the current exam. No visualized urolithiasis.

## 2023-04-28 ENCOUNTER — Ambulatory Visit: Payer: No Typology Code available for payment source | Admitting: Nurse Practitioner

## 2023-04-28 ENCOUNTER — Encounter: Payer: Self-pay | Admitting: Nurse Practitioner

## 2023-04-28 VITALS — BP 140/80 | HR 88 | Temp 98.2°F | Ht 71.0 in | Wt 391.0 lb

## 2023-04-28 DIAGNOSIS — F909 Attention-deficit hyperactivity disorder, unspecified type: Secondary | ICD-10-CM | POA: Diagnosis not present

## 2023-04-28 DIAGNOSIS — Z0000 Encounter for general adult medical examination without abnormal findings: Secondary | ICD-10-CM

## 2023-04-28 DIAGNOSIS — E119 Type 2 diabetes mellitus without complications: Secondary | ICD-10-CM | POA: Diagnosis not present

## 2023-04-28 DIAGNOSIS — Z7985 Long-term (current) use of injectable non-insulin antidiabetic drugs: Secondary | ICD-10-CM | POA: Diagnosis not present

## 2023-04-28 DIAGNOSIS — Z23 Encounter for immunization: Secondary | ICD-10-CM | POA: Diagnosis not present

## 2023-04-28 DIAGNOSIS — D67 Hereditary factor IX deficiency: Secondary | ICD-10-CM | POA: Diagnosis not present

## 2023-04-28 DIAGNOSIS — R030 Elevated blood-pressure reading, without diagnosis of hypertension: Secondary | ICD-10-CM

## 2023-04-28 HISTORY — DX: Encounter for general adult medical examination without abnormal findings: Z00.00

## 2023-04-28 HISTORY — DX: Type 2 diabetes mellitus without complications: E11.9

## 2023-04-28 LAB — POCT GLYCOSYLATED HEMOGLOBIN (HGB A1C): Hemoglobin A1C: 5.8 % — AB (ref 4.0–5.6)

## 2023-04-28 MED ORDER — OZEMPIC (0.25 OR 0.5 MG/DOSE) 2 MG/3ML ~~LOC~~ SOPN: 0.2500 mg | PEN_INJECTOR | SUBCUTANEOUS | 0 refills | Status: DC

## 2023-04-28 MED ORDER — LISDEXAMFETAMINE DIMESYLATE 20 MG PO CAPS: 20.0000 mg | ORAL_CAPSULE | Freq: Every day | ORAL | 0 refills | Status: DC

## 2023-04-28 NOTE — Assessment & Plan Note (Signed)
History of same.  Patient is followed by [facility] hematology once every 5 years per his report

## 2023-04-28 NOTE — Assessment & Plan Note (Signed)
History of the same.  We multifactorial given body habitus and being on a stimulant.  Continue to monitor stable at this juncture

## 2023-04-28 NOTE — Progress Notes (Signed)
New Patient Office Visit  Subjective    Patient ID: Chris Winters, male    DOB: 01/04/1981  Age: 42 y.o. MRN: [id number]  CC:  Chief Complaint  Patient presents with   Establish Care    HPI Chris Winters presents to establish care   Pre-daibetes: states that he will get hot flashes and get weak. States that last A1C was 6.5  ADD/ADHD: Patient currently maintained on 20 mg daily this is prescribed by Marcelino Duster, MD. States that he use to take adderall and it worked better. State that he started to break out in hives. States that he was switched to vyvanse. States that it does not work as well   Hepatitis C? States that his mother believed he did. 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 States that he has problems when he gets injured. [place]   for complete physical and follow up of chronic conditions.  Diet: Fair diet. He eats 3 meals a day and will snack. Water and diet soda. Diet soda is the main source  Exercise: No regular exercise. Employment  Eye exam: Completes annually. Glasses for working  Dental exam: Needs updating      Lung Cancer Screening: N/A  TDAP: 2007? Update today  KVQ:[id number]  Covid: original and booster  Pna: too young Shingles: tongue  Colonoscopy: Too young, currently average risk PSA: Too young, currently average risk  Outpatient Encounter Medications as of 04/28/2023  Medication Sig   lisdexamfetamine (VYVANSE) 20 MG capsule Take 1 capsule (20 mg total) by mouth daily.   lisdexamfetamine (VYVANSE) 20 MG capsule Take 1 capsule (20 mg total) by mouth daily.   lisdexamfetamine (VYVANSE) 20 MG capsule Take 1 capsule (20 mg total) by mouth daily.   Semaglutide,0.25 or 0.5MG /DOS, (OZEMPIC, 0.25 OR 0.5 MG/DOSE,) 2 MG/3ML SOPN Inject 0.25 mg into the skin once a week.   [DISCONTINUED] VYVANSE 20 MG capsule Take 20 mg by mouth every morning.    Melatonin 5 MG TABS Take 5 mg by mouth at bedtime.   Multiple Vitamins-Minerals (CENTRUM MEN) TABS Take 1 tablet by mouth daily.   ondansetron (ZOFRAN-ODT) 4 MG disintegrating tablet Take 1 tablet (4 mg total) by mouth every 8 (eight) hours as needed for nausea or vomiting.   oxyCODONE-acetaminophen (PERCOCET/ROXICET) 5-325 MG tablet Take 1 tablet by mouth every 4 (four) hours as needed for severe pain.   tamsulosin (FLOMAX) 0.4 MG CAPS capsule Take 1 capsule (0.4 mg total) by mouth daily.   [DISCONTINUED] amphetamine-dextroamphetamine (ADDERALL XR) 20 MG 24 hr capsule Take 20 mg by mouth every morning. (Patient not taking: Reported on 04/28/2023)   [DISCONTINUED] Ascorbic Acid (VITAMIN C PO) Take 1 tablet by mouth daily.   [DISCONTINUED] BIOTIN PO Take 1 tablet by mouth daily.   [DISCONTINUED] cetirizine (ZYRTEC) 10 MG tablet Take 10 mg by mouth daily.   [DISCONTINUED] cetirizine (ZYRTEC) 10 MG tablet 1 tablet   No facility-administered encounter medications on file as of 04/28/2023.    Past Medical History:  Diagnosis Date   Allergy    Clotting disorder (HCC) Birth   Hemophila B Factor 9   Controlled type 2 diabetes mellitus without complication, without long-term current use of insulin (HCC) 04/28/2023   Hemophilia B (HCC)    Preventative health care 04/28/2023    Past Surgical History:  Procedure Laterality Date   TONSILLECTOMY  Family History  Problem Relation Age of Onset   COPD Mother    Hemophilia Mother    Diabetes Father    Heart disease Father    Hemophilia Brother        both brothers deceased. other sibling had drug abuse.   Alcohol abuse Brother    Lung cancer Maternal Grandmother    Lung cancer Maternal Grandfather     Social History   Socioeconomic History   Marital status: Married    Spouse name: Victorino Dike   Number of children: 0   Years of education: Not on file   Highest education level: Master's degree (e.g., MA, MS, MEng, MEd, MSW, MBA)   Occupational History   Not on file  Tobacco Use   Smoking status: Never   Smokeless tobacco: Never  Vaping Use   Vaping status: Never Used  Substance and Sexual Activity   Alcohol use: No   Drug use: No   Sexual activity: Yes    Birth control/protection: None  Other Topics Concern   Not on file  Social History Narrative   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     Social Determinants of Health   Financial Resource Strain: Low Risk  (04/27/2023)   Overall Financial Resource Strain (CARDIA)    Difficulty of Paying Living Expenses: Not very hard  Food Insecurity: No Food Insecurity (04/27/2023)   Hunger Vital Sign    Worried About Running Out of Food in the Last Year: Never true    Ran Out of Food in the Last Year: Never true  Transportation Needs: No Transportation Needs (04/27/2023)   PRAPARE - Administrator, Civil Service (Medical): No    Lack of Transportation (Non-Medical): No  Physical Activity: Insufficiently Active (04/27/2023)   Exercise Vital Sign    Days of Exercise per Week: 1 day    Minutes of Exercise per Session: 10 min  Stress: Stress Concern Present (04/27/2023)   [employer] of Occupational Health - Occupational Stress Questionnaire    Feeling of Stress : To some extent  Social Connections: Socially Isolated (04/27/2023)   Social Connection and Isolation Panel [NHANES]    Frequency of Communication with Friends and Family: Once a week    Frequency of Social Gatherings with Friends and Family: Once a week    Attends Religious Services: Never    Database administrator or Organizations: No    Attends Engineer, structural: Not on file    Marital Status: Married  Catering manager Violence: Not on file    Review of Systems  Constitutional:  Negative for chills and fever.  Respiratory:  Negative for shortness of breath.   Cardiovascular:  Negative for chest pain and leg swelling.  Gastrointestinal:  Negative for  abdominal pain, blood in stool, constipation, diarrhea, nausea and vomiting.       BM daily   Genitourinary:  Negative for dysuria and hematuria.  Neurological:  Negative for tingling (carpal tunnel) and headaches.  Psychiatric/Behavioral:  Negative for hallucinations and suicidal ideas.         Objective    BP (!) 140/80   Pulse 88   Temp 98.2 F (36.8 C) (Oral)   Ht 5\' 11"  (1.803 m)   Wt (!) 391 lb (177.4 kg)   SpO2 97%   BMI 54.53 kg/m   Physical Exam Vitals and nursing note reviewed.  Constitutional:      Appearance: Normal appearance.  HENT:  Right Ear: Tympanic membrane, ear canal and external ear normal.     Left Ear: Tympanic membrane, ear canal and external ear normal.     Mouth/Throat:     Mouth: Mucous membranes are moist.     Pharynx: Oropharynx is clear.  Eyes:     Extraocular Movements: Extraocular movements intact.     Pupils: Pupils are equal, round, and reactive to light.  Cardiovascular:     Rate and Rhythm: Normal rate and regular rhythm.     Pulses: Normal pulses.     Heart sounds: Normal heart sounds.  Pulmonary:     Effort: Pulmonary effort is normal.     Breath sounds: Normal breath sounds.  Abdominal:     General: Bowel sounds are normal. There is no distension.     Palpations: There is no mass.     Tenderness: There is no abdominal tenderness.     Hernia: No hernia is present.  Musculoskeletal:     Right lower leg: No edema.     Left lower leg: No edema.  Lymphadenopathy:     Cervical: No cervical adenopathy.  Skin:    General: Skin is warm.  Neurological:     Mental Status: He is alert. Mental status is at baseline.     Deep Tendon Reflexes:     Reflex Scores:      Bicep reflexes are 2+ on the right side and 2+ on the left side.      Patellar reflexes are 0 on the right side and 2+ on the left side.    Comments: Bilateral upper and lower extremity strength 5/5  Psychiatric:        Mood and Affect: Mood normal.         Behavior: Behavior normal.        Thought Content: Thought content normal.        Judgment: Judgment normal.         Assessment & Plan:   Problem List Items Addressed This Visit       Endocrine   Controlled type 2 diabetes mellitus without complication, without long-term current use of insulin (HCC)    Previous A1c with previous provide was 6.5%.  It is 5.8% today.  Will still pursue Ozempic to help aid in weight reduction.  Follow-up 3 months      Relevant Medications   Semaglutide,0.25 or 0.5MG /DOS, (OZEMPIC, 0.25 OR 0.5 MG/DOSE,) 2 MG/3ML SOPN   Other Relevant Orders   CBC   Comprehensive metabolic panel   Lipid panel   Microalbumin / creatinine urine ratio   POCT glycosylated hemoglobin (Hb A1C)     Hematopoietic and Hemostatic   Hemophilia B (HCC)    History of same.  Patient is followed by [facility] hematology once every 5 years per his report      Relevant Orders   CBC     Other   Attention deficit hyperactivity disorder (ADHD)    History of Adderall in the past.  Patient states he started having hives and was switched to Vyvanse.  Currently on Vyvanse 20 daily.  Patient states is effective and is not as effective as Adderall.  PDMP reviewed today no red flags seen.  Controlled substance agreement signed today order placed for urine drug screen.  Vyvanse 20 mg refilled follow-up 3 months      Relevant Medications   lisdexamfetamine (VYVANSE) 20 MG capsule   lisdexamfetamine (VYVANSE) 20 MG capsule   lisdexamfetamine (VYVANSE) 20 MG  capsule   Other Relevant Orders   TSH   DRUG MONITORING, PANEL 8 WITH CONFIRMATION, URINE   Morbid obesity (HCC)    Pending labs such as TSH, A1c, lipid panel.  Work on lifestyle modifications      Relevant Medications   lisdexamfetamine (VYVANSE) 20 MG capsule   lisdexamfetamine (VYVANSE) 20 MG capsule   lisdexamfetamine (VYVANSE) 20 MG capsule   Semaglutide,0.25 or 0.5MG /DOS, (OZEMPIC, 0.25 OR 0.5 MG/DOSE,) 2 MG/3ML  SOPN   Elevated blood pressure reading    History of the same.  We multifactorial given body habitus and being on a stimulant.  Continue to monitor stable at this juncture      Relevant Orders   TSH   Preventative health care - Primary    Discussed age-appropriate immunizations and screening exams.  Did review patient's personal, surgical, social, family history.  Patient is up-to-date on all age-appropriate vaccinations he would like.  We did update tetanus vaccine and flu vaccine today.  Patient is too young for CRC screening or prostate cancer screening.  Patient was given information at discharge about preventative healthcare maintenance with anticipatory guidance.       Return in about 3 months (around 07/29/2023) for BP recheck, ADHD med recheck .   Audria Nine, NP

## 2023-04-28 NOTE — Assessment & Plan Note (Signed)
Discussed age-appropriate immunizations and screening exams.  Did review patient's personal, surgical, social, family history.  Patient is up-to-date on all age-appropriate vaccinations he would like.  We did update tetanus vaccine and flu vaccine today.  Patient is too young for CRC screening or prostate cancer screening.  Patient was given information at discharge about preventative healthcare maintenance with anticipatory guidance.

## 2023-04-28 NOTE — Assessment & Plan Note (Signed)
History of Adderall in the past.  Patient states he started having hives and was switched to Vyvanse.  Currently on Vyvanse 20 daily.  Patient states is effective and is not as effective as Adderall.  PDMP reviewed today no red flags seen.  Controlled substance agreement signed today order placed for urine drug screen.  Vyvanse 20 mg refilled follow-up 3 months

## 2023-04-28 NOTE — Assessment & Plan Note (Signed)
Pending labs such as TSH, A1c, lipid panel.  Work on lifestyle modifications

## 2023-04-28 NOTE — Patient Instructions (Signed)
Nice to see you today I will be in touch with the labs once I have reviewed them Make a fasting lab appointment over the next 2 week Follow up with me in 3 months, sooner if you need me

## 2023-04-28 NOTE — Assessment & Plan Note (Signed)
Previous A1c with previous provide was 6.5%.  It is 5.8% today.  Will still pursue Ozempic to help aid in weight reduction.  Follow-up 3 months

## 2023-05-06 ENCOUNTER — Other Ambulatory Visit (INDEPENDENT_AMBULATORY_CARE_PROVIDER_SITE_OTHER): Payer: No Typology Code available for payment source

## 2023-05-06 DIAGNOSIS — E119 Type 2 diabetes mellitus without complications: Secondary | ICD-10-CM | POA: Diagnosis not present

## 2023-05-06 DIAGNOSIS — R030 Elevated blood-pressure reading, without diagnosis of hypertension: Secondary | ICD-10-CM | POA: Diagnosis not present

## 2023-05-06 DIAGNOSIS — F909 Attention-deficit hyperactivity disorder, unspecified type: Secondary | ICD-10-CM | POA: Diagnosis not present

## 2023-05-06 DIAGNOSIS — D67 Hereditary factor IX deficiency: Secondary | ICD-10-CM

## 2023-05-06 LAB — COMPREHENSIVE METABOLIC PANEL
ALT: 33 U/L (ref 0–53)
AST: 24 U/L (ref 0–37)
Albumin: 4.2 g/dL (ref 3.5–5.2)
Alkaline Phosphatase: 65 U/L (ref 39–117)
BUN: 11 mg/dL (ref 6–23)
CO2: 27 meq/L (ref 19–32)
Calcium: 8.8 mg/dL (ref 8.4–10.5)
Chloride: 102 meq/L (ref 96–112)
Creatinine, Ser: 0.87 mg/dL (ref 0.40–1.50)
GFR: 107.28 mL/min (ref >60.00)
Glucose, Bld: 102 mg/dL — ABNORMAL HIGH (ref 70–99)
Potassium: 4.1 meq/L (ref 3.5–5.1)
Sodium: 136 meq/L (ref 135–145)
Total Bilirubin: 0.8 mg/dL (ref 0.2–1.2)
Total Protein: 7.2 g/dL (ref 6.0–8.3)

## 2023-05-06 LAB — LIPID PANEL
Cholesterol: 188 mg/dL (ref 0–200)
HDL: 35.1 mg/dL — ABNORMAL LOW (ref >39.00)
LDL Cholesterol: 119 mg/dL — ABNORMAL HIGH (ref 0–99)
NonHDL: 152.86
Total CHOL/HDL Ratio: 5
Triglycerides: 167 mg/dL — ABNORMAL HIGH (ref 0.0–149.0)
VLDL: 33.4 mg/dL (ref 0.0–40.0)

## 2023-05-06 LAB — CBC
HCT: 48.9 % (ref 39.0–52.0)
Hemoglobin: 16.2 g/dL (ref 13.0–17.0)
MCHC: 33.2 g/dL (ref 30.0–36.0)
MCV: 91.9 fL (ref 78.0–100.0)
Platelets: 158 10*3/uL (ref 150.0–400.0)
RBC: 5.32 Mil/uL (ref 4.22–5.81)
RDW: 13.6 % (ref 11.5–15.5)
WBC: 6.4 10*3/uL (ref 4.0–10.5)

## 2023-05-06 LAB — TSH: TSH: 4.12 u[IU]/mL (ref 0.35–5.50)

## 2023-05-06 LAB — MICROALBUMIN / CREATININE URINE RATIO
Creatinine,U: 240.8 mg/dL
Microalb Creat Ratio: 1.1 mg/g (ref 0.0–30.0)
Microalb, Ur: 2.6 mg/dL — ABNORMAL HIGH (ref 0.0–1.9)

## 2023-05-09 LAB — DRUG MONITORING, PANEL 8 WITH CONFIRMATION, URINE
6 Acetylmorphine: NEGATIVE ng/mL (ref <10)
Alcohol Metabolites: NEGATIVE ng/mL (ref <500)
Amphetamine: 409 ng/mL — ABNORMAL HIGH (ref <250)
Amphetamines: POSITIVE ng/mL — AB (ref <500)
Benzodiazepines: NEGATIVE ng/mL (ref <100)
Buprenorphine, Urine: NEGATIVE ng/mL (ref <5)
Cocaine Metabolite: NEGATIVE ng/mL (ref <150)
Creatinine: 264.2 mg/dL (ref >=20.0)
MDMA: NEGATIVE ng/mL (ref <500)
Marijuana Metabolite: NEGATIVE ng/mL (ref <20)
Methamphetamine: NEGATIVE ng/mL (ref <250)
Opiates: NEGATIVE ng/mL (ref <100)
Oxidant: NEGATIVE ug/mL (ref <200)
Oxycodone: NEGATIVE ng/mL (ref <100)
pH: 6.6 (ref 4.5–9.0)

## 2023-05-09 LAB — DM TEMPLATE

## 2023-05-23 ENCOUNTER — Other Ambulatory Visit: Payer: Self-pay | Admitting: Nurse Practitioner

## 2023-05-23 DIAGNOSIS — E119 Type 2 diabetes mellitus without complications: Secondary | ICD-10-CM

## 2023-07-01 ENCOUNTER — Other Ambulatory Visit: Payer: Self-pay | Admitting: Nurse Practitioner

## 2023-07-01 DIAGNOSIS — F909 Attention-deficit hyperactivity disorder, unspecified type: Secondary | ICD-10-CM

## 2023-07-15 ENCOUNTER — Other Ambulatory Visit: Payer: Self-pay | Admitting: Nurse Practitioner

## 2023-07-15 DIAGNOSIS — E119 Type 2 diabetes mellitus without complications: Secondary | ICD-10-CM

## 2023-07-29 ENCOUNTER — Encounter: Payer: Self-pay | Admitting: Nurse Practitioner

## 2023-07-29 ENCOUNTER — Ambulatory Visit: Payer: No Typology Code available for payment source | Admitting: Nurse Practitioner

## 2023-07-29 VITALS — BP 120/86 | HR 75 | Temp 98.0°F | Ht 71.0 in | Wt 360.6 lb

## 2023-07-29 DIAGNOSIS — F909 Attention-deficit hyperactivity disorder, unspecified type: Secondary | ICD-10-CM

## 2023-07-29 DIAGNOSIS — R030 Elevated blood-pressure reading, without diagnosis of hypertension: Secondary | ICD-10-CM

## 2023-07-29 DIAGNOSIS — E119 Type 2 diabetes mellitus without complications: Secondary | ICD-10-CM | POA: Diagnosis not present

## 2023-07-29 DIAGNOSIS — Z6841 Body Mass Index (BMI) 40.0 and over, adult: Secondary | ICD-10-CM

## 2023-07-29 LAB — POCT GLYCOSYLATED HEMOGLOBIN (HGB A1C): Hemoglobin A1C: 5.2 % (ref 4.0–5.6)

## 2023-07-29 MED ORDER — VYVANSE 20 MG PO CAPS: 20.0000 mg | ORAL_CAPSULE | Freq: Every day | ORAL | 0 refills | Status: DC

## 2023-07-29 NOTE — Progress Notes (Signed)
Established Patient Office Visit  Subjective   Patient ID: Chris Winters, male    DOB: 1980-10-22  Age: 43 y.o. MRN: [id number]  Chief Complaint  Patient presents with   Diabetes      DM2: Patient's last A1c was well-controlled.  We did start patient on Ozempic 0.25 mg and titrate up to aid in patient's weight loss.  He is here for follow-up. States that he is tolerating the ozempic well. He is eating 3 meals a day. He will do smaller breakfast. He will have a packed lunch and sometimes he will not eat. He will have a small dish and some fruit.  He has cut out regular sodas. He will have an occassional sodas  BM 1-2 times a day and he is taking a fiber supplement to help with BM  ADHD: Patient currently maintained on Vyvanse 20 mg daily.  Patient is tolerating the medication well.  He states that his insurance will pay for name brand.  States is not as effective as Adderall but he did have allergic reaction Adderall.  Medication is helping him function.  Elevated blood pressure: Patient has a history of elevated blood pressure in the past with no diagnosis of hypertension patient is not on any antihypertensives we will continue to work on lifestyle modifications while being aware of him being on stimulant medication.    Review of Systems  Constitutional:  Negative for chills and fever.  Respiratory:  Negative for shortness of breath.   Cardiovascular:  Negative for chest pain.  Gastrointestinal:  Positive for constipation.      Objective:     BP 120/86   Pulse 75   Temp 98 F (36.7 C) (Oral)   Ht 5\' 11"  (1.803 m)   Wt (!) 360 lb 9.6 oz (163.6 kg)   SpO2 94%   BMI 50.29 kg/m  BP Readings from Last 3 Encounters:  07/29/23 120/86  04/28/23 (!) 140/80  11/28/21 120/74   Wt Readings from Last 3 Encounters:  07/29/23 (!) 360 lb 9.6 oz (163.6 kg)  04/28/23 (!) 391 lb (177.4 kg)  11/28/21 (!) 340 lb (154.2 kg)   SpO2 Readings from Last 3 Encounters:  07/29/23 94%   04/28/23 97%  11/16/21 (!) 86%      Physical Exam Vitals and nursing note reviewed.  Constitutional:      Appearance: Normal appearance.  Cardiovascular:     Rate and Rhythm: Normal rate and regular rhythm.     Heart sounds: Normal heart sounds.  Pulmonary:     Effort: Pulmonary effort is normal.     Breath sounds: Normal breath sounds.  Neurological:     Mental Status: He is alert.      Results for orders placed or performed in visit on 07/29/23  POCT glycosylated hemoglobin (Hb A1C)  Result Value Ref Range   Hemoglobin A1C 5.2 4.0 - 5.6 %   HbA1c POC (<> result, manual entry)     HbA1c, POC (prediabetic range)     HbA1c, POC (controlled diabetic range)        The 10-year ASCVD risk score (Arnett DK, et al., 2019) is: 3.2%    Assessment & Plan:   Problem List Items Addressed This Visit       Endocrine   Controlled type 2 diabetes mellitus without complication, without long-term current use of insulin (HCC) - Primary   Patient currently maintained on Ozempic 0.5 mg once weekly.  A1c well-controlled with A1c under 6%.  Patient not have any hypoglycemia.  He has had appreciable weight loss of over 30 pounds.  Patient was interested in going up to 1 mg weekly.  "Patient worries that to see if he can have continued weight loss on the current medicine regimen.      Relevant Orders   POCT glycosylated hemoglobin (Hb A1C) (Completed)     Other   Attention deficit hyperactivity disorder (ADHD)   Patient currently maintained on Vyvanse 20 mg daily.  PDMP reviewed.  No suspicious activity continue medication as prescribed refills provided today      Relevant Medications   VYVANSE 20 MG capsule   VYVANSE 20 MG capsule   VYVANSE 20 MG capsule   Morbid obesity (HCC)   Patient is working on lifestyle modifications along with being on semaglutide for diabetes.  Patient has had a pressure weight loss.  Praise given continue with healthy lifestyle modifications       Relevant Medications   VYVANSE 20 MG capsule   VYVANSE 20 MG capsule   VYVANSE 20 MG capsule   Elevated blood pressure reading   History of same that has resolved with lifestyle modifications and weight loss.  Continue working on lifestyle modifications       Return in about 3 months (around 10/27/2023) for weight/DM recheck .    Audria Nine, NP

## 2023-07-29 NOTE — Assessment & Plan Note (Signed)
History of same that has resolved with lifestyle modifications and weight loss.  Continue working on lifestyle modifications

## 2023-07-29 NOTE — Patient Instructions (Signed)
Nice to see you today I have sent in the 3 scripts of vyvanse We will continue the ozempic as is Follow up with me in 3 months, sooner if you need me

## 2023-07-29 NOTE — Assessment & Plan Note (Signed)
Patient currently maintained on Ozempic 0.5 mg once weekly.  A1c well-controlled with A1c under 6%.  Patient not have any hypoglycemia.  He has had appreciable weight loss of over 30 pounds.  Patient was interested in going up to 1 mg weekly.  "Patient worries that to see if he can have continued weight loss on the current medicine regimen.

## 2023-07-29 NOTE — Assessment & Plan Note (Signed)
Patient currently maintained on Vyvanse 20 mg daily.  PDMP reviewed.  No suspicious activity continue medication as prescribed refills provided today

## 2023-07-29 NOTE — Assessment & Plan Note (Signed)
Patient is working on lifestyle modifications along with being on semaglutide for diabetes.  Patient has had a pressure weight loss.  Praise given continue with healthy lifestyle modifications

## 2023-09-14 ENCOUNTER — Other Ambulatory Visit: Payer: Self-pay | Admitting: Nurse Practitioner

## 2023-09-14 DIAGNOSIS — F909 Attention-deficit hyperactivity disorder, unspecified type: Secondary | ICD-10-CM

## 2023-09-15 ENCOUNTER — Encounter: Payer: Self-pay | Admitting: Nurse Practitioner

## 2023-10-15 ENCOUNTER — Other Ambulatory Visit: Payer: Self-pay | Admitting: Nurse Practitioner

## 2023-10-15 DIAGNOSIS — E119 Type 2 diabetes mellitus without complications: Secondary | ICD-10-CM

## 2023-10-20 ENCOUNTER — Other Ambulatory Visit: Payer: Self-pay

## 2023-10-20 ENCOUNTER — Encounter (HOSPITAL_COMMUNITY): Payer: Self-pay

## 2023-10-20 ENCOUNTER — Emergency Department (HOSPITAL_COMMUNITY): Admission: EM | Admit: 2023-10-20 | Discharge: 2023-10-21 | Discharge status: Against Medical Advice

## 2023-10-20 DIAGNOSIS — M25551 Pain in right hip: Secondary | ICD-10-CM | POA: Diagnosis present

## 2023-10-20 DIAGNOSIS — Z5321 Procedure and treatment not carried out due to patient leaving prior to being seen by health care provider: Secondary | ICD-10-CM | POA: Insufficient documentation

## 2023-10-20 LAB — COMPREHENSIVE METABOLIC PANEL WITH GFR
ALT: 27 U/L (ref 0–44)
AST: 28 U/L (ref 15–41)
Albumin: 4.4 g/dL (ref 3.5–5.0)
Alkaline Phosphatase: 49 U/L (ref 38–126)
Anion gap: 10 (ref 5–15)
BUN: 7 mg/dL (ref 6–20)
CO2: 21 mmol/L — ABNORMAL LOW (ref 22–32)
Calcium: 9.4 mg/dL (ref 8.9–10.3)
Chloride: 103 mmol/L (ref 98–111)
Creatinine, Ser: 0.89 mg/dL (ref 0.61–1.24)
GFR, Estimated: >60 mL/min (ref >60)
Glucose, Bld: 94 mg/dL (ref 70–99)
Potassium: 3.8 mmol/L (ref 3.5–5.1)
Sodium: 134 mmol/L — ABNORMAL LOW (ref 135–145)
Total Bilirubin: 1.3 mg/dL — ABNORMAL HIGH (ref 0.0–1.2)
Total Protein: 8.2 g/dL — ABNORMAL HIGH (ref 6.5–8.1)

## 2023-10-20 LAB — CBC
HCT: 51.6 % (ref 39.0–52.0)
Hemoglobin: 17.4 g/dL — ABNORMAL HIGH (ref 13.0–17.0)
MCH: 30.2 pg (ref 26.0–34.0)
MCHC: 33.7 g/dL (ref 30.0–36.0)
MCV: 89.4 fL (ref 80.0–100.0)
Platelets: 174 10*3/uL (ref 150–400)
RBC: 5.77 MIL/uL (ref 4.22–5.81)
RDW: 12.6 % (ref 11.5–15.5)
WBC: 8.5 10*3/uL (ref 4.0–10.5)
nRBC: 0 % (ref 0.0–0.2)

## 2023-10-20 LAB — CK: Total CK: 155 U/L (ref 49–397)

## 2023-10-20 MED ORDER — OXYCODONE-ACETAMINOPHEN 5-325 MG PO TABS
2.0000 | ORAL_TABLET | Freq: Once | ORAL | Status: AC
  Administered 2023-10-20: 2 via ORAL
  Filled 2023-10-20: qty 2

## 2023-10-20 NOTE — ED Provider Triage Note (Signed)
 Emergency Medicine Provider Triage Evaluation Note  Chris Winters , a 43 y.o. male  was evaluated in triage.  Pt complains of R lateral thigh/hip pain. 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 Pain has been ongoing for several days, no known injury however he has been on his feet much more for work in the last several weeks. Ambulating has become progressively more difficult secondary to pain/numbness/burning.  Review of Systems  Positive: As above Negative: As above  Physical Exam  BP (!) 157/108   Pulse 81   Temp 98.4 F (36.9 C)   Resp 17   Ht 6' (1.829 m)   Wt (!) 151 kg   SpO2 98%   BMI 45.16 kg/m  Gen:   Awake, in pain Resp:  Normal effort  MSK:   R thigh compartments are soft, no obvious hematoma or skin changes Other:    Medical Decision Making  Medically screening exam initiated at 7:19 PM.  Appropriate orders placed.  Chris Winters was informed that the remainder of the evaluation will be completed by another provider, this initial triage assessment does not replace that evaluation, and the importance of remaining in the ED until their evaluation is complete.     Kendrick Pax, [place] 10/20/23 1927

## 2023-10-20 NOTE — ED Triage Notes (Addendum)
 Pt here for right sided hip pain. Denies recent falls. Pt did recent strenuous activity. HX of hemophiliac. Denies bruising to hip

## 2023-10-21 ENCOUNTER — Other Ambulatory Visit: Payer: Self-pay

## 2023-10-21 ENCOUNTER — Emergency Department
Admission: EM | Admit: 2023-10-21 | Discharge: 2023-10-21 | Disposition: A | Discharge status: Home / Self Care | Attending: Emergency Medicine | Admitting: Emergency Medicine

## 2023-10-21 ENCOUNTER — Encounter: Payer: Self-pay | Admitting: *Deleted

## 2023-10-21 ENCOUNTER — Emergency Department

## 2023-10-21 DIAGNOSIS — E119 Type 2 diabetes mellitus without complications: Secondary | ICD-10-CM | POA: Insufficient documentation

## 2023-10-21 DIAGNOSIS — R791 Abnormal coagulation profile: Secondary | ICD-10-CM | POA: Diagnosis not present

## 2023-10-21 DIAGNOSIS — T148XXA Other injury of unspecified body region, initial encounter: Secondary | ICD-10-CM

## 2023-10-21 DIAGNOSIS — M79651 Pain in right thigh: Secondary | ICD-10-CM

## 2023-10-21 DIAGNOSIS — X58XXXA Exposure to other specified factors, initial encounter: Secondary | ICD-10-CM | POA: Insufficient documentation

## 2023-10-21 DIAGNOSIS — S79921A Unspecified injury of right thigh, initial encounter: Secondary | ICD-10-CM | POA: Diagnosis present

## 2023-10-21 DIAGNOSIS — S76911A Strain of unspecified muscles, fascia and tendons at thigh level, right thigh, initial encounter: Secondary | ICD-10-CM | POA: Insufficient documentation

## 2023-10-21 LAB — BASIC METABOLIC PANEL WITH GFR
Anion gap: 8 (ref 5–15)
BUN: 8 mg/dL (ref 6–20)
CO2: 22 mmol/L (ref 22–32)
Calcium: 8.9 mg/dL (ref 8.9–10.3)
Chloride: 101 mmol/L (ref 98–111)
Creatinine, Ser: 0.9 mg/dL (ref 0.61–1.24)
GFR, Estimated: >60 mL/min (ref >60)
Glucose, Bld: 68 mg/dL — ABNORMAL LOW (ref 70–99)
Potassium: 3.1 mmol/L — ABNORMAL LOW (ref 3.5–5.1)
Sodium: 131 mmol/L — ABNORMAL LOW (ref 135–145)

## 2023-10-21 LAB — CBC WITH DIFFERENTIAL/PLATELET
Abs Immature Granulocytes: 0.04 10*3/uL (ref 0.00–0.07)
Basophils Absolute: 0 10*3/uL (ref 0.0–0.1)
Basophils Relative: 0 %
Eosinophils Absolute: 0.1 10*3/uL (ref 0.0–0.5)
Eosinophils Relative: 1 %
HCT: 48.9 % (ref 39.0–52.0)
Hemoglobin: 16.9 g/dL (ref 13.0–17.0)
Immature Granulocytes: 1 %
Lymphocytes Relative: 25 %
Lymphs Abs: 1.8 10*3/uL (ref 0.7–4.0)
MCH: 31 pg (ref 26.0–34.0)
MCHC: 34.6 g/dL (ref 30.0–36.0)
MCV: 89.7 fL (ref 80.0–100.0)
Monocytes Absolute: 0.9 10*3/uL (ref 0.1–1.0)
Monocytes Relative: 12 %
Neutro Abs: 4.5 10*3/uL (ref 1.7–7.7)
Neutrophils Relative %: 61 %
Platelets: 160 10*3/uL (ref 150–400)
RBC: 5.45 MIL/uL (ref 4.22–5.81)
RDW: 12.7 % (ref 11.5–15.5)
WBC: 7.2 10*3/uL (ref 4.0–10.5)
nRBC: 0 % (ref 0.0–0.2)

## 2023-10-21 LAB — CBG MONITORING, ED: Glucose-Capillary: 86 mg/dL (ref 70–99)

## 2023-10-21 LAB — APTT: aPTT: 50 s — ABNORMAL HIGH (ref 24–36)

## 2023-10-21 LAB — PROTIME-INR
INR: 1 (ref 0.8–1.2)
Prothrombin Time: 13.3 s (ref 11.4–15.2)

## 2023-10-21 LAB — CK: Total CK: 124 U/L (ref 49–397)

## 2023-10-21 MED ORDER — HYDROMORPHONE HCL 1 MG/ML IJ SOLN
1.0000 mg | Freq: Once | INTRAMUSCULAR | Status: AC
  Administered 2023-10-21: 1 mg via INTRAVENOUS
  Filled 2023-10-21: qty 1

## 2023-10-21 MED ORDER — IOHEXOL 350 MG/ML SOLN
125.0000 mL | Freq: Once | INTRAVENOUS | Status: AC | PRN
  Administered 2023-10-21: 125 mL via INTRAVENOUS

## 2023-10-21 MED ORDER — ONDANSETRON HCL 4 MG/2ML IJ SOLN
4.0000 mg | Freq: Once | INTRAMUSCULAR | Status: AC
  Administered 2023-10-21: 4 mg via INTRAVENOUS
  Filled 2023-10-21: qty 2

## 2023-10-21 MED ORDER — HYDROMORPHONE HCL 2 MG PO TABS: 2.0000 mg | ORAL_TABLET | Freq: Four times a day (QID) | ORAL | 0 refills | Status: DC | PRN

## 2023-10-21 MED ORDER — HYDROMORPHONE HCL 2 MG PO TABS
2.0000 mg | ORAL_TABLET | Freq: Once | ORAL | Status: AC
  Administered 2023-10-21: 2 mg via ORAL
  Filled 2023-10-21: qty 1

## 2023-10-21 MED ORDER — DIAZEPAM 5 MG PO TABS: 5.0000 mg | ORAL_TABLET | Freq: Three times a day (TID) | ORAL | 0 refills | Status: DC | PRN

## 2023-10-21 MED ORDER — DIAZEPAM 5 MG PO TABS
5.0000 mg | ORAL_TABLET | Freq: Once | ORAL | Status: AC
  Administered 2023-10-21: 5 mg via ORAL
  Filled 2023-10-21: qty 1

## 2023-10-21 NOTE — ED Triage Notes (Signed)
 Pt to triage via wheelchair.  Pt has right hip/buttock pain   pt states pain radiates into right leg.  Hx hemophilia   no known injury to leg.  Pt alert.

## 2023-10-21 NOTE — Discharge Instructions (Addendum)
 Wear Ace wrap and use crutches as needed.  You may take medicines as needed for pain and muscle spasms.  Return to the ER for worsening symptoms, persistent vomiting, difficulty breathing or other concerns.

## 2023-10-21 NOTE — ED Notes (Signed)
 Pt along with family came up to this Clinical research associate and stated that they are leaving the ER due to the long wait

## 2023-10-21 NOTE — ED Provider Notes (Signed)
 [facility] Provider Note    Event Date/Time   First MD Initiated Contact with Patient 10/21/23 [phone]     (approximate)   History   Hip Pain   HPI  REGIE BUNNER is a 43 y.o. male who presents to the ED from home with a chief complaint of nontraumatic right hip/thigh pain x 2 days.  Patient with a history of hemophilia B.  Last seen by [facility] hematology in 2019 after muscle bleed.  Given rare frequency of bleeding, it was decided he would not have factor IX at home.  States he has been working more at work and increased his steps from 18,000-30,000 steps daily.  Denies fall/injury/trauma.  Denies headache, vision changes, neck pain, chest pain, shortness of breath, abdominal pain, nausea, vomiting or dizziness.  Left without being seen from [facility] yesterday due to the long wait.    Past Medical History   Past Medical History:  Diagnosis Date   Allergy    Clotting disorder (HCC) Birth   Hemophila B Factor 9   Controlled type 2 diabetes mellitus without complication, without long-term current use of insulin (HCC) 04/28/2023   Hemophilia B (HCC)    Preventative health care 04/28/2023     Active Problem List   Patient Active Problem List   Diagnosis Date Noted   Attention deficit hyperactivity disorder (ADHD) 04/28/2023   Controlled type 2 diabetes mellitus without complication, without long-term current use of insulin (HCC) 04/28/2023   Morbid obesity (HCC) 04/28/2023   Elevated blood pressure reading 04/28/2023   Preventative health care 04/28/2023   Leg swelling 04/02/2017   Left leg swelling 03/31/2017   Anemia 03/31/2017   Chronic hepatitis C without hepatic coma (HCC) 07/02/2016   Hemophilia B (HCC) 07/02/2016     Past Surgical History   Past Surgical History:  Procedure Laterality Date   TONSILLECTOMY       Home Medications   Prior to Admission medications   Medication Sig Start Date End Date Taking? Authorizing Provider   cetirizine (ZYRTEC) 10 MG tablet 1 tablet Orally Once a day    [provider]  Semaglutide ,0.25 or 0.5MG /DOS, (OZEMPIC , 0.25 OR 0.5 MG/DOSE,) 2 MG/3ML SOPN INJECT 0.5 MG INTO THE SKIN ONE TIME PER WEEK 10/18/23   Dorothe Gaster, NP  VYVANSE  20 MG capsule Take 1 capsule (20 mg total) by mouth daily. 07/29/23   Dorothe Gaster, NP  VYVANSE  20 MG capsule Take 1 capsule (20 mg total) by mouth daily. 07/29/23   Dorothe Gaster, NP  VYVANSE  20 MG capsule Take 1 capsule (20 mg total) by mouth daily. 07/29/23   Dorothe Gaster, NP     Allergies  Penicillins, Adderall [amphetamine-dextroamphetamine], Aspirin, and Morphine and codeine   Family History   Family History  Problem Relation Age of Onset   COPD Mother    Hemophilia Mother    Diabetes Father    Heart disease Father    Hemophilia Brother        both brothers deceased. other sibling had drug abuse.   Alcohol abuse Brother    Lung cancer Maternal Grandmother    Lung cancer Maternal Grandfather      Physical Exam  Triage Vital Signs: ED Triage Vitals  Encounter Vitals Group     BP 10/21/23 0126 (!) 159/102     Systolic BP Percentile --      Diastolic BP Percentile --      Pulse Rate 10/21/23 0126  77     Resp 10/21/23 0126 18     Temp 10/21/23 0126 98.2 F (36.8 C)     Temp Source 10/21/23 0126 Oral     SpO2 10/21/23 0126 100 %     Weight 10/21/23 0127 (!) 333 lb (151 kg)     Height 10/21/23 0127 6' (1.829 m)     Head Circumference --      Peak Flow --      Pain Score 10/21/23 0127 5     Pain Loc --      Pain Education --      Exclude from Growth Chart --     Updated Vital Signs: BP 112/64   Pulse 68   Temp 98.2 F (36.8 C) (Oral)   Resp 16   Ht 6' (1.829 m)   Wt (!) 151 kg   SpO2 92%   BMI 45.16 kg/m    General: Awake, mild distress.  CV:  RRR.  Good peripheral perfusion.  Resp:  Normal effort.  CTAB. Abd:  No distention.  Other:  Right hip/buttock/thigh without evidence of bruising or hematoma.   Compartment is soft and supple.  No pallor noted.  No tenseness.  Right posterior thigh tender to palpation.  2+ femoral and distal pulses.  Brisk, less than 5-second capillary refill.   ED Results / Procedures / Treatments  Labs (all labs ordered are listed, but only abnormal results are displayed) Labs Reviewed  BASIC METABOLIC PANEL WITH GFR - Abnormal; Notable for the following components:      Result Value   Sodium 131 (*)    Potassium 3.1 (*)    Glucose, Bld 68 (*)    All other components within normal limits  APTT - Abnormal; Notable for the following components:   aPTT 50 (*)    All other components within normal limits  CBC WITH DIFFERENTIAL/PLATELET  CK  PROTIME-INR     EKG  None   RADIOLOGY I have independently visualized interpreted patient's imaging study as well as noted the radiology interpretation:  CTA lower extremity: No signs of active bleeding, subcutaneous or intramuscular hematoma; no acute osseous injury  Official radiology report(s): CT ANGIO LOWER EXT BILAT W &/OR WO CONTRAST Result Date: 10/21/2023 CLINICAL DATA:  Hemophilia. Pain in right hip/thigh evaluate for bleeding EXAM: CT ANGIOGRAPHY OF THE BILATERAL LOWEREXTREMITY TECHNIQUE: Multidetector CT imaging of the bilateral lower extremitieswas performed using the standard protocol during bolus administration of intravenous contrast. Multiplanar CT image reconstructions and MIPs were obtained to evaluate the vascular anatomy. RADIATION DOSE REDUCTION: This exam was performed according to the departmental dose-optimization program which includes automated exposure control, adjustment of the mA and/or kV according to patient size and/or use of iterative reconstruction technique. CONTRAST:  OMNIPAQUE  IOHEXOL  350 MG/ML SOLN COMPARISON:  03/31/2017 FINDINGS: Bones/Joint/Cartilage: No signs of acute fracture or dislocation. No signs of joint effusion or hemarthrosis. No signs of arthropathy. Muscles and  tendons: Bilateral and symmetric appearance of the lower musculature of the lower extremities. No signs of subcutaneous or intramuscular hematoma. No abnormal edema or focal fluid collections identified. Ligaments: Sub optimally assessed by CT Soft tissues: Patent lower extremity vasculature. No signs of contrast extravasation to suggest active bleeding and/or pseudoaneurysm. Atherosclerotic calcifications noted within the visualized portions of the distal abdominal aorta. Review of the MIP images confirms the above findings. IMPRESSION: 1. No signs of contrast extravasation to suggest active bleeding and/or pseudoaneurysm. 2. No signs of subcutaneous or intramuscular hematoma. 3. No  signs of acute fracture or dislocation. 4.  Aortic Atherosclerosis (ICD10-I70.0). Electronically Signed   By: Kimberley Penman M.D.   On: 10/21/2023 04:53     PROCEDURES:  Critical Care performed: No  .1-3 Lead EKG Interpretation  Performed by: Norlene Beavers, MD Authorized by: Norlene Beavers, MD     Interpretation: normal     ECG rate:  65   ECG rate assessment: normal     Rhythm: sinus rhythm     Ectopy: none     Conduction: normal   Comments:     Patient placed on cardiac monitor to evaluate for arrhythmias    MEDICATIONS ORDERED IN ED: Medications  diazepam  (VALIUM ) tablet 5 mg (has no administration in time range)  HYDROmorphone  (DILAUDID ) tablet 2 mg (has no administration in time range)  ondansetron  (ZOFRAN ) injection 4 mg (4 mg Intravenous Given 10/21/23 0256)  HYDROmorphone  (DILAUDID ) injection 1 mg (1 mg Intravenous Given 10/21/23 0256)  iohexol  (OMNIPAQUE ) 350 MG/ML injection 125 mL (125 mLs Intravenous Contrast Given 10/21/23 0330)     IMPRESSION / MDM / ASSESSMENT AND PLAN / ED COURSE  I reviewed the triage vital signs and the nursing notes.                             43 year old male with hemophilia B presenting with right posterior thigh pain.  Differential diagnosis includes but is not  limited to muscular bleeding, hematoma, contusion, strain, etc. I have personally reviewed patient's records and note his [facility] hematology visit from 2019 For the Hemophilia: Current Treatment Plan on-demand Emergency treatment plan 100U/kg factor bolus and call coagulation consult service"   Patient's presentation is most consistent with acute complicated illness / injury requiring diagnostic workup.  The patient is on the cardiac monitor to evaluate for evidence of arrhythmia and/or significant heart rate changes.  I have also reviewed laboratory results done at [facility] yesterday which demonstrates unremarkable kidney function and stable hemoglobin.  Will repeat lab work, obtain CTA extremity to evaluate for active extravasation.  Administer IV Dilaudid  for pain and reassess.  Clinical Course as of 10/21/23 0515  Thu Oct 21, 2023  0513 CTA negative for bleed, hematoma.  Patient feeling somewhat better.  Will add oral pain medication and muscle relaxer, Ace wrap, crutches and patient will follow-up closely with his PCP.  Strict return precautions given.  Patient and spouse verbalized understanding and agree with plan of care. [JS]    Clinical Course User Index [JS] Norlene Beavers, MD     FINAL CLINICAL IMPRESSION(S) / ED DIAGNOSES   Final diagnoses:  Acute pain of right thigh  Muscle strain     Rx / DC Orders   ED Discharge Orders     None        Note:  This document was prepared using Dragon voice recognition software and may include unintentional dictation errors.   Araseli Sherry J, MD 10/21/23 [phone]

## 2023-10-23 ENCOUNTER — Other Ambulatory Visit: Payer: Self-pay | Admitting: Physician Assistant

## 2023-10-23 ENCOUNTER — Ambulatory Visit
Admission: RE | Admit: 2023-10-23 | Discharge: 2023-10-23 | Disposition: A | Discharge status: Home / Self Care | Source: Ambulatory Visit | Attending: Physician Assistant | Admitting: Physician Assistant

## 2023-10-23 ENCOUNTER — Ambulatory Visit: Admit: 2023-10-23

## 2023-10-23 DIAGNOSIS — M7989 Other specified soft tissue disorders: Secondary | ICD-10-CM | POA: Insufficient documentation

## 2023-10-25 ENCOUNTER — Telehealth: Payer: Self-pay

## 2023-10-25 NOTE — Telephone Encounter (Signed)
 Copied from CRM [id number]. Topic: Clinical - Medical Advice >> Oct 25, 2023 12:31 PM Chris Winters wrote: Reason for CRM: Patients wife called in to figure out if they will have to go through the urgent care for an MRI or may Dr. Rannie Byars order an MRI was advised she would have to be evaluated again she understood

## 2023-10-26 NOTE — Telephone Encounter (Signed)
 Urgent care generally does not order MRIs or CT scans.  If they want to schedule an appointment to be evaluated. I assume this is related to the ED visit on 10/21/2023?

## 2023-10-26 NOTE — Telephone Encounter (Signed)
 Left detailed voicemail and mychart message for patient to call the office back if there are any questions or concerns.

## 2023-10-28 ENCOUNTER — Ambulatory Visit: Payer: No Typology Code available for payment source | Admitting: Nurse Practitioner

## 2023-10-28 VITALS — BP 128/84 | HR 76 | Temp 97.7°F | Ht 72.0 in | Wt 329.6 lb

## 2023-10-28 DIAGNOSIS — Z7985 Long-term (current) use of injectable non-insulin antidiabetic drugs: Secondary | ICD-10-CM

## 2023-10-28 DIAGNOSIS — E119 Type 2 diabetes mellitus without complications: Secondary | ICD-10-CM | POA: Diagnosis not present

## 2023-10-28 DIAGNOSIS — M25551 Pain in right hip: Secondary | ICD-10-CM

## 2023-10-28 DIAGNOSIS — F909 Attention-deficit hyperactivity disorder, unspecified type: Secondary | ICD-10-CM | POA: Diagnosis not present

## 2023-10-28 DIAGNOSIS — Z09 Encounter for follow-up examination after completed treatment for conditions other than malignant neoplasm: Secondary | ICD-10-CM | POA: Diagnosis not present

## 2023-10-28 LAB — POCT GLYCOSYLATED HEMOGLOBIN (HGB A1C): Hemoglobin A1C: 5.1 % (ref 4.0–5.6)

## 2023-10-28 NOTE — Assessment & Plan Note (Signed)
 Did review his emergency department note along with imaging and labs.

## 2023-10-28 NOTE — Assessment & Plan Note (Signed)
 Patient currently maintained on Vyvanse  20 mg daily.  Tolerating medication well.  Spouse states good improvement since being on this medication.  Continue medication as prescribed

## 2023-10-28 NOTE — Patient Instructions (Signed)
 Nice to see you today Let me know what the ortho says Follow up with me in 3 months, sooner if you need m

## 2023-10-28 NOTE — Assessment & Plan Note (Signed)
 A1c well-controlled.  Patient still needs to continue lose weight on Ozempic  0.5 mg.  Continue medication as prescribed continue work on healthy lifestyle medications.

## 2023-10-28 NOTE — Assessment & Plan Note (Signed)
 Patient still having discomfort.  Was evaluated emergency department and [facility].  He had CT scan done, ultrasound, MRI.  All were negative has a follow-up on Monday with [facility].  Patient was given out patient pain medication, muscle relaxer, steroids.  States that the muscle laxer seems to be helping the best she is taking Flexeril 10 mg as needed

## 2023-10-28 NOTE — Progress Notes (Signed)
 Established Patient Office Visit  Subjective   Patient ID: Chris Winters, male    DOB: 01/19/1981  Age: 43 y.o. MRN: [id number]  Chief Complaint  Patient presents with   Diabetes   Hospitalization Follow-up    Pt complains of ongoing pain in right thigh with tingling and numbing. Pt was given flexeril and prednisone in ED. Pt still cannot sit and states it makes It hard to work.       DM2: patient is currently on ozempic  0.5mg  weekly.  Patient is not currently checking sugars at home. He is fiber supplements and was going every day. He is currently on pain medication that did cause constopation and he is back to normal   ADHD: patient is currently on 20mg  vyvanse . He is tolerating medication well.  Patient spouse is here she states she noticed a great difference since patient has been off the Adderall.  Hosptial follow up: Patient was seen in the hospital for 2425 with complaint of nontraumatic right hip/thigh pain x 2 days he does have a history of hemophilia B last seen by [facility] hematology in 2019 after a muscle bleed.  In the emergency department they did do an appropriate workup inclusive of blood work and a CT to make sure there was no bleed.  Patient was given Valium , hydromorphone , Zofran .  Patient was instructed to use crutches and an Ace wrap and follow-up with PCP.  He was also give patient pain medication along with muscle relaxers. He was prescribed dilaudid  and valium  from the ED. States that he did see [facility] on Saturday. He has appointment with them tomorrow. They did a US  and MR hip. They gave hims steroids and muscle relxer. He is having pain in the hip with sitting. Less pain with movement. States that the leg feels week and having numbness on the outside lower extremity that is intermittent. 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  Respiratory:  Negative for shortness of breath.   Cardiovascular:  Negative for chest pain.  Neurological:  Negative for headaches.  Psychiatric/Behavioral:  Negative for hallucinations and suicidal ideas.       Objective:     BP 128/84   Pulse 76   Temp 97.7 F (36.5 C) (Oral)   Ht 6' (1.829 m)   Wt (!) 329 lb 9.6 oz (149.5 kg)   SpO2 96%   BMI 44.70 kg/m  BP Readings from Last 3 Encounters:  10/28/23 128/84  10/21/23 (!) 131/94  10/20/23 (!) 157/108   Wt Readings from Last 3 Encounters:  10/28/23 (!) 329 lb 9.6 oz (149.5 kg)  10/21/23 (!) 333 lb (151 kg)  10/20/23 (!) 333 lb (151 kg)   SpO2 Readings from Last 3 Encounters:  10/28/23 96%  10/21/23 100%  10/20/23 98%      Physical Exam Vitals and nursing note reviewed.  Constitutional:      Appearance: Normal appearance.  Cardiovascular:     Rate and Rhythm: Normal rate and regular rhythm.     Pulses:          Posterior tibial pulses are 2+ on the right side and 2+ on the left side.     Heart sounds: Normal heart sounds.  Pulmonary:     Effort: Pulmonary effort is normal.  Breath sounds: Normal breath sounds.  Abdominal:     General: Bowel sounds are normal.  Musculoskeletal:     Right lower leg: No edema.     Left lower leg: No edema.  Neurological:     Mental Status: He is alert.     Deep Tendon Reflexes:     Reflex Scores:      Patellar reflexes are 0 on the right side and 2+ on the left side.    Comments: Bilateral lower extremity strength 5/5      Results for orders placed or performed in visit on 10/28/23  POCT glycosylated hemoglobin (Hb A1C)  Result Value Ref Range   Hemoglobin A1C 5.1 4.0 - 5.6 %   HbA1c POC (<> result, manual entry)     HbA1c, POC (prediabetic range)     HbA1c, POC (controlled diabetic range)        The 10-year ASCVD risk score (Arnett DK, et al., 2019) is: 3.5%    Assessment & Plan:   Problem List Items Addressed This Visit        Endocrine   Controlled type 2 diabetes mellitus without complication, without long-term current use of insulin (HCC) - Primary   A1c well-controlled.  Patient still needs to continue lose weight on Ozempic  0.5 mg.  Continue medication as prescribed continue work on healthy lifestyle medications.      Relevant Orders   POCT glycosylated hemoglobin (Hb A1C) (Completed)     Other   Attention deficit hyperactivity disorder (ADHD)   Patient currently maintained on Vyvanse  20 mg daily.  Tolerating medication well.  Spouse states good improvement since being on this medication.  Continue medication as prescribed      Hospital discharge follow-up   Did review his emergency department note along with imaging and labs.      Right hip pain   Patient still having discomfort.  Was evaluated emergency department and [facility].  He had CT scan done, ultrasound, MRI.  All were negative has a follow-up on Monday with [facility].  Patient was given out patient pain medication, muscle relaxer, steroids.  States that the muscle laxer seems to be helping the best she is taking Flexeril 10 mg as needed       Return in about 3 months (around 01/28/2024) for weight and BP recheck .    Margarie Shay, NP

## 2023-11-01 ENCOUNTER — Other Ambulatory Visit: Payer: Self-pay | Admitting: Nurse Practitioner

## 2023-11-01 DIAGNOSIS — F909 Attention-deficit hyperactivity disorder, unspecified type: Secondary | ICD-10-CM

## 2023-11-01 MED ORDER — VYVANSE 20 MG PO CAPS: 20.0000 mg | ORAL_CAPSULE | Freq: Every day | ORAL | 0 refills | Status: DC

## 2024-01-12 ENCOUNTER — Other Ambulatory Visit: Payer: Self-pay | Admitting: Nurse Practitioner

## 2024-01-12 DIAGNOSIS — E119 Type 2 diabetes mellitus without complications: Secondary | ICD-10-CM

## 2024-02-01 ENCOUNTER — Ambulatory Visit (INDEPENDENT_AMBULATORY_CARE_PROVIDER_SITE_OTHER): Admitting: Nurse Practitioner

## 2024-02-01 VITALS — BP 130/82 | HR 75 | Temp 97.9°F | Ht 72.0 in | Wt 350.8 lb

## 2024-02-01 DIAGNOSIS — F909 Attention-deficit hyperactivity disorder, unspecified type: Secondary | ICD-10-CM

## 2024-02-01 DIAGNOSIS — E119 Type 2 diabetes mellitus without complications: Secondary | ICD-10-CM | POA: Diagnosis not present

## 2024-02-01 MED ORDER — LISDEXAMFETAMINE DIMESYLATE 30 MG PO CAPS: 30.0000 mg | ORAL_CAPSULE | Freq: Every day | ORAL | 0 refills | Status: DC

## 2024-02-01 MED ORDER — SEMAGLUTIDE (1 MG/DOSE) 4 MG/3ML ~~LOC~~ SOPN: 1.0000 mg | PEN_INJECTOR | SUBCUTANEOUS | 0 refills | Status: DC

## 2024-02-01 NOTE — Progress Notes (Signed)
 Established Patient Office Visit  Subjective   Patient ID: Chris Winters, male    DOB: [birth year]  Age: 43 y.o. MRN: [id number]  Chief Complaint  Patient presents with   Follow-up    HPI  DM2: Patient currently maintained onOzempic 0.5 mg weekly.  Patient's last A1c was well-controlled. His weight weigh was 329 and todays weight was 350. States that he was down with his back. He got an injection in June. He has been working back to his activity.  States that he is only doing work currently. States that he has not been hiking as he has been afraid.  States that his appetitie has not been eaffected. He is eating 3 meals a day and then will snack. States that he use to be distracted at work and not snack at work Coca-Cola soda and some water   ADD: Patient currently maintained on Vyvanse  20 mg daily. States that he does not feel like it is not lasting as long. He is getting approx 6 hours of use. States that he con    Review of Systems  Constitutional:  Negative for chills and fever.  Respiratory:  Negative for shortness of breath.   Cardiovascular:  Negative for chest pain.  Gastrointestinal:        Bm daily   Neurological:  Negative for dizziness and headaches.      Objective:     BP 130/82   Pulse 75   Temp 97.9 F (36.6 C) (Oral)   Ht 6' (1.829 m)   Wt (!) 350 lb 12.8 oz (159.1 kg)   SpO2 96%   BMI 47.58 kg/m  BP Readings from Last 3 Encounters:  02/01/24 130/82  10/28/23 128/84  10/21/23 (!) 131/94   Wt Readings from Last 3 Encounters:  02/01/24 (!) 350 lb 12.8 oz (159.1 kg)  10/28/23 (!) 329 lb 9.6 oz (149.5 kg)  10/21/23 (!) 333 lb (151 kg)   SpO2 Readings from Last 3 Encounters:  02/01/24 96%  10/28/23 96%  10/21/23 100%      Physical Exam Vitals and nursing note reviewed.  Constitutional:      Appearance: Normal appearance.  Cardiovascular:     Rate and Rhythm: Normal rate and regular rhythm.     Heart sounds: Normal heart sounds.   Pulmonary:     Effort: Pulmonary effort is normal.     Breath sounds: Normal breath sounds.  Abdominal:     General: Bowel sounds are normal.  Neurological:     Mental Status: He is alert.      No results found for any visits on 02/01/24.    The 10-year ASCVD risk score (Arnett DK, et al., 2019) is: 4%    Assessment & Plan:   Problem List Items Addressed This Visit       Endocrine   Controlled type 2 diabetes mellitus without complication, without long-term current use of insulin (HCC)   Patient's last A1c was within normal limits.  Patient has gained approximate 30 pounds secondary to sedentary lifestyle.  Will increase Ozempic  to 1 mg weekly.  Patient can use 2 of the 0.5 mg injections on the same day if he would like to finish up his previous supply.  Patient encouraged to start working on exercise and other lifestyle modifications.  To aid in weight loss      Relevant Medications   Semaglutide , 1 MG/DOSE, 4 MG/3ML SOPN     Other   Attention deficit hyperactivity disorder (ADHD) -  Primary   History of same was maintained on Vyvanse  20 mg which was effective.  Patient states that is significant not been as effective.  Will increase Vyvanse  to 30 mg daily.  Patient is denying any adverse drug events.      Relevant Medications   lisdexamfetamine (VYVANSE ) 30 MG capsule   lisdexamfetamine (VYVANSE ) 30 MG capsule   lisdexamfetamine (VYVANSE ) 30 MG capsule   Morbid obesity (HCC)   Relevant Medications   Semaglutide , 1 MG/DOSE, 4 MG/3ML SOPN   lisdexamfetamine (VYVANSE ) 30 MG capsule   lisdexamfetamine (VYVANSE ) 30 MG capsule   lisdexamfetamine (VYVANSE ) 30 MG capsule    Return in about 3 months (around 05/03/2024) for DM recheck, weight, ADHD.    Adina Crandall, NP

## 2024-02-01 NOTE — Assessment & Plan Note (Signed)
 History of same was maintained on Vyvanse  20 mg which was effective.  Patient states that is significant not been as effective.  Will increase Vyvanse  to 30 mg daily.  Patient is denying any adverse drug events.

## 2024-02-01 NOTE — Assessment & Plan Note (Signed)
 Patient's last A1c was within normal limits.  Patient has gained approximate 30 pounds secondary to sedentary lifestyle.  Will increase Ozempic  to 1 mg weekly.  Patient can use 2 of the 0.5 mg injections on the same day if he would like to finish up his previous supply.  Patient encouraged to start working on exercise and other lifestyle modifications.  To aid in weight loss

## 2024-02-01 NOTE — Patient Instructions (Signed)
 Nice to see you today I have increased the ozempic  to 1 mg a week You can take 2 of the 0.5mg  pens on injection day to finish up your old supply Follow up with me in 3 months

## 2024-03-25 ENCOUNTER — Other Ambulatory Visit: Payer: Self-pay | Admitting: Nurse Practitioner

## 2024-03-25 DIAGNOSIS — E119 Type 2 diabetes mellitus without complications: Secondary | ICD-10-CM

## 2024-05-05 ENCOUNTER — Ambulatory Visit: Admitting: Nurse Practitioner

## 2024-05-05 VITALS — BP 120/88 | HR 75 | Temp 98.1°F | Ht 72.0 in | Wt 341.6 lb

## 2024-05-05 DIAGNOSIS — Z23 Encounter for immunization: Secondary | ICD-10-CM | POA: Diagnosis not present

## 2024-05-05 DIAGNOSIS — F909 Attention-deficit hyperactivity disorder, unspecified type: Secondary | ICD-10-CM

## 2024-05-05 DIAGNOSIS — E119 Type 2 diabetes mellitus without complications: Secondary | ICD-10-CM

## 2024-05-05 DIAGNOSIS — R5383 Other fatigue: Secondary | ICD-10-CM | POA: Diagnosis not present

## 2024-05-05 DIAGNOSIS — Z7985 Long-term (current) use of injectable non-insulin antidiabetic drugs: Secondary | ICD-10-CM

## 2024-05-05 LAB — TSH: TSH: 4.37 u[IU]/mL (ref 0.35–5.50)

## 2024-05-05 LAB — VITAMIN D 25 HYDROXY (VIT D DEFICIENCY, FRACTURES): VITD: 8.28 ng/mL — ABNORMAL LOW (ref 30.00–100.00)

## 2024-05-05 LAB — CBC
HCT: 49.5 % (ref 39.0–52.0)
Hemoglobin: 16.5 g/dL (ref 13.0–17.0)
MCHC: 33.4 g/dL (ref 30.0–36.0)
MCV: 90 fl (ref 78.0–100.0)
Platelets: 149 K/uL — ABNORMAL LOW (ref 150.0–400.0)
RBC: 5.5 Mil/uL (ref 4.22–5.81)
RDW: 13.9 % (ref 11.5–15.5)
WBC: 5.4 K/uL (ref 4.0–10.5)

## 2024-05-05 LAB — VITAMIN B12: Vitamin B-12: 233 pg/mL (ref 211–911)

## 2024-05-05 LAB — POCT GLYCOSYLATED HEMOGLOBIN (HGB A1C): Hemoglobin A1C: 5 % (ref 4.0–5.6)

## 2024-05-05 MED ORDER — LISDEXAMFETAMINE DIMESYLATE 30 MG PO CAPS
30.0000 mg | ORAL_CAPSULE | Freq: Every day | ORAL | 0 refills | Status: AC
Start: 2024-05-05 — End: ?

## 2024-05-05 MED ORDER — LISDEXAMFETAMINE DIMESYLATE 30 MG PO CAPS: 30.0000 mg | ORAL_CAPSULE | Freq: Every day | ORAL | 0 refills | Status: AC

## 2024-05-05 NOTE — Patient Instructions (Signed)
 Nice to see you today I will be in touch with the labs once I have them  Follow up with me in 6 months for you physical and full panel of labs

## 2024-05-05 NOTE — Progress Notes (Signed)
 Established Patient Office Visit  Subjective   Patient ID: Chris Winters, male    DOB: 1981-03-15  Age: 43 y.o. MRN: [id number]  Chief Complaint  Patient presents with   Diabetes    Discussed the use of AI scribe software for clinical note transcription with the patient, who gave verbal consent to proceed.  History of Present Illness Chris Winters is a 43 year old male who presents for follow-up on medication adjustments and weight management.  He has experienced no side effects from the recent increase in Ozempic  from 0.5 mg to 1 mg. He notes some weight loss and a decrease in appetite, stating he is not as hungry as often. Overeating results in discomfort, which discourages excessive eating.  His physical activity is primarily work-related, achieving between 10,000 to 20,000 steps a day. However, he feels fatigued after work, which affects his energy levels for additional exercise.  He reports no episodes of hypoglycemia.  Regarding Vyvanse , the dose was increased by 10 mg, and he now feels it lasts the full 12 hours. He reports no issues with sleep onset.  He is interested in checking his testosterone levels due to persistent fatigue and variable motivation, though he denies erectile dysfunction. His sexual drive is more mentally than physically limited, stating 'things work' but sometimes lacks the physical desire.  No constipation, low blood sugar episodes, fever, chills, chest pain, or shortness of breath.  He mentions a history of receiving the anthrax vaccine during his time in the [employer].     Review of Systems  Constitutional:  Negative for chills and fever.  Respiratory:  Negative for shortness of breath.   Cardiovascular:  Negative for chest pain.  Gastrointestinal:  Negative for abdominal pain and constipation.  Neurological:  Negative for headaches.  Psychiatric/Behavioral:  Negative for hallucinations and suicidal ideas.       Objective:      BP 120/88   Pulse 75   Temp 98.1 F (36.7 C) (Oral)   Ht 6' (1.829 m)   Wt (!) 341 lb 9.6 oz (154.9 kg)   SpO2 96%   BMI 46.33 kg/m  BP Readings from Last 3 Encounters:  05/05/24 120/88  02/01/24 130/82  10/28/23 128/84   Wt Readings from Last 3 Encounters:  05/05/24 (!) 341 lb 9.6 oz (154.9 kg)  02/01/24 (!) 350 lb 12.8 oz (159.1 kg)  10/28/23 (!) 329 lb 9.6 oz (149.5 kg)   SpO2 Readings from Last 3 Encounters:  05/05/24 96%  02/01/24 96%  10/28/23 96%      Physical Exam Vitals and nursing note reviewed.  Constitutional:      Appearance: Normal appearance.  Cardiovascular:     Rate and Rhythm: Normal rate and regular rhythm.     Heart sounds: Normal heart sounds.  Pulmonary:     Effort: Pulmonary effort is normal.     Breath sounds: Normal breath sounds.  Abdominal:     General: Bowel sounds are normal.  Neurological:     Mental Status: He is alert.      Results for orders placed or performed in visit on 05/05/24  POCT glycosylated hemoglobin (Hb A1C)  Result Value Ref Range   Hemoglobin A1C 5.0 4.0 - 5.6 %   HbA1c POC (<> result, manual entry)     HbA1c, POC (prediabetic range)     HbA1c, POC (controlled diabetic range)        The 10-year ASCVD risk score (Arnett DK, et al., 2019) is:  3.5%    Assessment & Plan:   Problem List Items Addressed This Visit       Endocrine   Controlled type 2 diabetes mellitus without complication, without long-term current use of insulin (HCC) - Primary   Relevant Orders   POCT glycosylated hemoglobin (Hb A1C) (Completed)   CBC   Vitamin B12     Other   Morbid obesity (HCC)   Relevant Orders   TSH   Other Visit Diagnoses       Fatigue, unspecified type       Relevant Orders   CBC   Vitamin B12   TSH   VITAMIN D 25 Hydroxy (Vit-D Deficiency, Fractures)   Testosterone Total,Free,Bio, Males     Assessment and Plan Assessment & Plan Type 2 diabetes mellitus and morbid obesity Diabetes  well-controlled with A1c of 5.0. Weight loss with increased Ozempic  dosage. No hypoglycemic episodes or side effects from Ozempic . Limited physical activity due to fatigue, but achieves 10,000 to 20,000 steps daily at work. - Continue Ozempic  1 mg weekly. - Encouraged increased physical activity, such as walking during lunch breaks.  Attention deficit hyperactivity disorder (ADHD) ADHD symptoms well-managed with Vyvanse  30 mg daily. Medication effective for 12 hours without affecting sleep. - Continue Vyvanse  30 mg daily.  Fatigue Persistent fatigue with decreased motivation and physical energy. Possible low testosterone levels considered. - Ordered testosterone level test. - Ordered CBC, B12, vitamin D, and thyroid  function tests.  General Health Maintenance No pneumonia vaccine received. Discussed benefits and potential side effects of pneumonia vaccine. - Administered flu shot today. - Administered pneumonia vaccine today.   Return in about 6 months (around 11/02/2024) for CPE and Labs.    Adina Crandall, NP

## 2024-05-06 LAB — TESTOSTERONE TOTAL,FREE,BIO, MALES
Albumin: 4.5 g/dL (ref 3.6–5.1)
Sex Hormone Binding: 26 nmol/L (ref 10–50)
Testosterone, Bioavailable: 116.6 ng/dL (ref 110.0–575.0)
Testosterone, Free: 56.7 pg/mL (ref 46.0–224.0)
Testosterone: 363 ng/dL (ref 250–827)

## 2024-05-10 ENCOUNTER — Ambulatory Visit: Payer: Self-pay | Admitting: Nurse Practitioner

## 2024-05-10 DIAGNOSIS — E559 Vitamin D deficiency, unspecified: Secondary | ICD-10-CM

## 2024-05-10 MED ORDER — VITAMIN D (ERGOCALCIFEROL) 1.25 MG (50000 UNIT) PO CAPS: 50000.0000 [IU] | ORAL_CAPSULE | ORAL | 0 refills | Status: AC

## 2024-05-23 ENCOUNTER — Other Ambulatory Visit: Payer: Self-pay | Admitting: Nurse Practitioner

## 2024-05-23 DIAGNOSIS — E119 Type 2 diabetes mellitus without complications: Secondary | ICD-10-CM

## 2024-11-10 ENCOUNTER — Encounter: Admitting: Nurse Practitioner
